# Patient Record
Sex: Male | Born: 1965 | Race: White | Hispanic: No | Smoking: Never smoker
Health system: Southern US, Community
[De-identification: ages and names within clinical notes are randomized; demographics above are authoritative.]

## PROBLEM LIST (undated history)

## (undated) DIAGNOSIS — I10 Essential (primary) hypertension: Secondary | ICD-10-CM

## (undated) DIAGNOSIS — E119 Type 2 diabetes mellitus without complications: Secondary | ICD-10-CM

## (undated) DIAGNOSIS — I219 Acute myocardial infarction, unspecified: Secondary | ICD-10-CM

## (undated) DIAGNOSIS — J939 Pneumothorax, unspecified: Secondary | ICD-10-CM

## (undated) HISTORY — PX: CHOLECYSTECTOMY: SHX55

---

## 2013-02-04 ENCOUNTER — Ambulatory Visit: Payer: Self-pay | Admitting: Family Medicine

## 2014-08-25 ENCOUNTER — Observation Stay: Payer: Self-pay | Admitting: Internal Medicine

## 2014-08-25 LAB — PRO B NATRIURETIC PEPTIDE: B-Type Natriuretic Peptide: 28 pg/mL (ref 0–125)

## 2014-08-25 LAB — CK TOTAL AND CKMB (NOT AT ARMC)
CK, TOTAL: 100 U/L (ref 39–308)
CK, Total: 106 U/L (ref 39–308)
CK, Total: 101 U/L (ref 39–308)
CK-MB: 1.2 ng/mL (ref 0.5–3.6)
CK-MB: 3.3 ng/mL (ref 0.5–3.6)
CK-MB: 3.5 ng/mL (ref 0.5–3.6)

## 2014-08-25 LAB — CBC
HCT: 44.5 % (ref 40.0–52.0)
HGB: 14.9 g/dL (ref 13.0–18.0)
MCH: 29.6 pg (ref 26.0–34.0)
MCHC: 33.4 g/dL (ref 32.0–36.0)
MCV: 89 fL (ref 80–100)
Platelet: 224 10*3/uL (ref 150–440)
RBC: 5.02 10*6/uL (ref 4.40–5.90)
RDW: 13.2 % (ref 11.5–14.5)
WBC: 6.3 10*3/uL (ref 3.8–10.6)

## 2014-08-25 LAB — CK-MB
CK-MB: 3.2 ng/mL (ref 0.5–3.6)
CK-MB: 2.7 ng/mL (ref 0.5–3.6)

## 2014-08-25 LAB — BASIC METABOLIC PANEL
Anion Gap: 8 (ref 7–16)
BUN: 16 mg/dL (ref 7–18)
CHLORIDE: 100 mmol/L (ref 98–107)
Calcium, Total: 8.8 mg/dL (ref 8.5–10.1)
Co2: 29 mmol/L (ref 21–32)
Creatinine: 1.2 mg/dL (ref 0.60–1.30)
EGFR (African American): >60
EGFR (Non-African Amer.): >60
Glucose: 197 mg/dL — ABNORMAL HIGH (ref 65–99)
OSMOLALITY: 280 (ref 275–301)
POTASSIUM: 3.7 mmol/L (ref 3.5–5.1)
Sodium: 137 mmol/L (ref 136–145)

## 2014-08-25 LAB — TROPONIN I
TROPONIN-I: 0.39 ng/mL — AB
Troponin-I: 0.28 ng/mL — ABNORMAL HIGH

## 2014-08-25 LAB — HEMOGLOBIN A1C: Hemoglobin A1C: 8.4 % — ABNORMAL HIGH (ref 4.2–6.3)

## 2014-08-26 LAB — LIPID PANEL
Cholesterol: 147 mg/dL (ref 0–200)
HDL Cholesterol: 22 mg/dL — ABNORMAL LOW (ref 40–60)
LDL CHOLESTEROL, CALC: 65 mg/dL (ref 0–100)
TRIGLYCERIDES: 300 mg/dL — AB (ref 0–200)
VLDL Cholesterol, Calc: 60 mg/dL — ABNORMAL HIGH (ref 5–40)

## 2014-08-26 LAB — TSH: THYROID STIMULATING HORM: 3.69 u[IU]/mL

## 2014-08-27 ENCOUNTER — Inpatient Hospital Stay
Admission: AD | Admit: 2014-08-27 | Payer: Self-pay | Source: Other Acute Inpatient Hospital | Admitting: Cardiothoracic Surgery

## 2014-08-27 ENCOUNTER — Inpatient Hospital Stay (HOSPITAL_COMMUNITY)
Admission: AD | Admit: 2014-08-27 | Discharge: 2014-09-04 | DRG: 236 | Disposition: A | Discharge status: Home / Self Care | Payer: 59 | Source: Other Acute Inpatient Hospital | Attending: Cardiothoracic Surgery | Admitting: Cardiothoracic Surgery

## 2014-08-27 DIAGNOSIS — I200 Unstable angina: Secondary | ICD-10-CM

## 2014-08-27 DIAGNOSIS — D696 Thrombocytopenia, unspecified: Secondary | ICD-10-CM | POA: Diagnosis not present

## 2014-08-27 DIAGNOSIS — J939 Pneumothorax, unspecified: Secondary | ICD-10-CM

## 2014-08-27 DIAGNOSIS — E669 Obesity, unspecified: Secondary | ICD-10-CM | POA: Diagnosis present

## 2014-08-27 DIAGNOSIS — Z6839 Body mass index (BMI) 39.0-39.9, adult: Secondary | ICD-10-CM | POA: Diagnosis not present

## 2014-08-27 DIAGNOSIS — E781 Pure hyperglyceridemia: Secondary | ICD-10-CM | POA: Diagnosis present

## 2014-08-27 DIAGNOSIS — E119 Type 2 diabetes mellitus without complications: Secondary | ICD-10-CM

## 2014-08-27 DIAGNOSIS — I214 Non-ST elevation (NSTEMI) myocardial infarction: Secondary | ICD-10-CM | POA: Diagnosis present

## 2014-08-27 DIAGNOSIS — Z6841 Body Mass Index (BMI) 40.0 and over, adult: Secondary | ICD-10-CM

## 2014-08-27 DIAGNOSIS — Z7982 Long term (current) use of aspirin: Secondary | ICD-10-CM | POA: Diagnosis not present

## 2014-08-27 DIAGNOSIS — R079 Chest pain, unspecified: Secondary | ICD-10-CM | POA: Diagnosis present

## 2014-08-27 DIAGNOSIS — E785 Hyperlipidemia, unspecified: Secondary | ICD-10-CM | POA: Diagnosis present

## 2014-08-27 DIAGNOSIS — I2510 Atherosclerotic heart disease of native coronary artery without angina pectoris: Secondary | ICD-10-CM | POA: Diagnosis present

## 2014-08-27 DIAGNOSIS — I10 Essential (primary) hypertension: Secondary | ICD-10-CM

## 2014-08-27 DIAGNOSIS — E8770 Fluid overload, unspecified: Secondary | ICD-10-CM | POA: Diagnosis not present

## 2014-08-27 DIAGNOSIS — I25110 Atherosclerotic heart disease of native coronary artery with unstable angina pectoris: Secondary | ICD-10-CM

## 2014-08-27 DIAGNOSIS — D62 Acute posthemorrhagic anemia: Secondary | ICD-10-CM | POA: Diagnosis not present

## 2014-08-27 DIAGNOSIS — R0602 Shortness of breath: Secondary | ICD-10-CM

## 2014-08-27 DIAGNOSIS — Z951 Presence of aortocoronary bypass graft: Secondary | ICD-10-CM

## 2014-08-27 HISTORY — DX: Acute myocardial infarction, unspecified: I21.9

## 2014-08-27 HISTORY — DX: Pneumothorax, unspecified: J93.9

## 2014-08-27 HISTORY — DX: Type 2 diabetes mellitus without complications: E11.9

## 2014-08-27 HISTORY — DX: Essential (primary) hypertension: I10

## 2014-08-27 LAB — CBC
HCT: 44.1 % (ref 39.0–52.0)
Hemoglobin: 15 g/dL (ref 13.0–17.0)
MCH: 29.5 pg (ref 26.0–34.0)
MCHC: 34 g/dL (ref 30.0–36.0)
MCV: 86.6 fL (ref 78.0–100.0)
Platelets: 236 10*3/uL (ref 150–400)
RBC: 5.09 MIL/uL (ref 4.22–5.81)
RDW: 13.2 % (ref 11.5–15.5)
WBC: 8.6 10*3/uL (ref 4.0–10.5)

## 2014-08-27 LAB — SURGICAL PCR SCREEN
MRSA, PCR: NEGATIVE
Staphylococcus aureus: POSITIVE — AB

## 2014-08-27 LAB — CREATININE, SERUM
Creatinine, Ser: 1.14 mg/dL (ref 0.50–1.35)
GFR calc Af Amer: 85 mL/min — ABNORMAL LOW (ref >90)
GFR calc non Af Amer: 73 mL/min — ABNORMAL LOW (ref >90)

## 2014-08-27 LAB — URINALYSIS, ROUTINE W REFLEX MICROSCOPIC
Bilirubin Urine: NEGATIVE
Glucose, UA: NEGATIVE mg/dL
Hgb urine dipstick: NEGATIVE
Ketones, ur: NEGATIVE mg/dL
Leukocytes, UA: NEGATIVE
Nitrite: NEGATIVE
Protein, ur: NEGATIVE mg/dL
Specific Gravity, Urine: 1.015 (ref 1.005–1.030)
Urobilinogen, UA: 1 mg/dL (ref 0.0–1.0)
pH: 5.5 (ref 5.0–8.0)

## 2014-08-27 LAB — GLUCOSE, CAPILLARY
Glucose-Capillary: 181 mg/dL — ABNORMAL HIGH (ref 70–99)
Glucose-Capillary: 180 mg/dL — ABNORMAL HIGH (ref 70–99)
Glucose-Capillary: 155 mg/dL — ABNORMAL HIGH (ref 70–99)

## 2014-08-27 MED ORDER — METOPROLOL TARTRATE 50 MG PO TABS
50.0000 mg | ORAL_TABLET | Freq: Two times a day (BID) | ORAL | Status: DC
  Administered 2014-08-27 – 2014-08-29 (×5): 50 mg via ORAL
  Filled 2014-08-27 (×7): qty 1

## 2014-08-27 MED ORDER — DOCUSATE SODIUM 100 MG PO CAPS
100.0000 mg | ORAL_CAPSULE | Freq: Two times a day (BID) | ORAL | Status: DC
  Administered 2014-08-27 – 2014-08-29 (×6): 100 mg via ORAL
  Filled 2014-08-27 (×8): qty 1

## 2014-08-27 MED ORDER — INSULIN ASPART 100 UNIT/ML ~~LOC~~ SOLN
0.0000 [IU] | Freq: Three times a day (TID) | SUBCUTANEOUS | Status: DC
  Administered 2014-08-27 – 2014-08-28 (×3): 4 [IU] via SUBCUTANEOUS
  Administered 2014-08-28: 3 [IU] via SUBCUTANEOUS
  Administered 2014-08-29 (×2): 4 [IU] via SUBCUTANEOUS

## 2014-08-27 MED ORDER — SODIUM CHLORIDE 0.9 % IJ SOLN: 3.0000 mL | INTRAMUSCULAR | Status: DC | PRN

## 2014-08-27 MED ORDER — ENOXAPARIN SODIUM 40 MG/0.4ML ~~LOC~~ SOLN
40.0000 mg | SUBCUTANEOUS | Status: DC
  Administered 2014-08-27 – 2014-08-29 (×3): 40 mg via SUBCUTANEOUS
  Filled 2014-08-27 (×4): qty 0.4

## 2014-08-27 MED ORDER — SODIUM CHLORIDE 0.9 % IJ SOLN
3.0000 mL | Freq: Two times a day (BID) | INTRAMUSCULAR | Status: DC
  Administered 2014-08-27 – 2014-08-29 (×5): 3 mL via INTRAVENOUS

## 2014-08-27 MED ORDER — ALUM & MAG HYDROXIDE-SIMETH 200-200-20 MG/5ML PO SUSP: 30.0000 mL | Freq: Four times a day (QID) | ORAL | Status: DC | PRN

## 2014-08-27 MED ORDER — ADULT MULTIVITAMIN W/MINERALS CH
1.0000 | ORAL_TABLET | Freq: Every day | ORAL | Status: DC
  Administered 2014-08-27 – 2014-08-29 (×3): 1 via ORAL
  Filled 2014-08-27 (×4): qty 1

## 2014-08-27 MED ORDER — TRAMADOL HCL 50 MG PO TABS: 50.0000 mg | ORAL_TABLET | Freq: Four times a day (QID) | ORAL | Status: DC | PRN

## 2014-08-27 MED ORDER — SODIUM CHLORIDE 0.9 % IV SOLN: 250.0000 mL | INTRAVENOUS | Status: DC | PRN

## 2014-08-27 MED ORDER — ZOLPIDEM TARTRATE 5 MG PO TABS: 5.0000 mg | ORAL_TABLET | Freq: Every evening | ORAL | Status: DC | PRN

## 2014-08-27 MED ORDER — LOSARTAN POTASSIUM 50 MG PO TABS: 50.0000 mg | ORAL_TABLET | Freq: Every day | ORAL | Status: DC

## 2014-08-27 MED ORDER — LOSARTAN POTASSIUM 50 MG PO TABS
50.0000 mg | ORAL_TABLET | Freq: Two times a day (BID) | ORAL | Status: DC
  Administered 2014-08-27 – 2014-08-29 (×5): 50 mg via ORAL
  Filled 2014-08-27 (×9): qty 1

## 2014-08-27 MED ORDER — SODIUM CHLORIDE 0.9 % IJ SOLN
3.0000 mL | Freq: Two times a day (BID) | INTRAMUSCULAR | Status: DC
  Administered 2014-08-27 – 2014-08-28 (×2): 3 mL via INTRAVENOUS

## 2014-08-27 MED ORDER — ACETAMINOPHEN 325 MG PO TABS: 650.0000 mg | ORAL_TABLET | Freq: Four times a day (QID) | ORAL | Status: DC | PRN

## 2014-08-27 MED ORDER — ENOXAPARIN SODIUM 40 MG/0.4ML ~~LOC~~ SOLN: 40.0000 mg | SUBCUTANEOUS | Status: DC

## 2014-08-27 MED ORDER — SORBITOL 70 % SOLN
30.0000 mL | Freq: Every day | Status: DC | PRN
  Filled 2014-08-27: qty 30

## 2014-08-27 MED ORDER — PANTOPRAZOLE SODIUM 40 MG PO TBEC
40.0000 mg | DELAYED_RELEASE_TABLET | Freq: Every day | ORAL | Status: DC
  Administered 2014-08-27 – 2014-08-29 (×3): 40 mg via ORAL
  Filled 2014-08-27 (×3): qty 1

## 2014-08-27 MED ORDER — INSULIN ASPART 100 UNIT/ML ~~LOC~~ SOLN: 0.0000 [IU] | Freq: Every day | SUBCUTANEOUS | Status: DC

## 2014-08-27 MED ORDER — INSULIN GLARGINE 100 UNIT/ML ~~LOC~~ SOLN
20.0000 [IU] | Freq: Two times a day (BID) | SUBCUTANEOUS | Status: DC
  Administered 2014-08-27: 20 [IU] via SUBCUTANEOUS
  Filled 2014-08-27 (×5): qty 0.2

## 2014-08-27 MED ORDER — ACETAMINOPHEN 650 MG RE SUPP: 650.0000 mg | Freq: Four times a day (QID) | RECTAL | Status: DC | PRN

## 2014-08-27 MED ORDER — ASPIRIN EC 325 MG PO TBEC
325.0000 mg | DELAYED_RELEASE_TABLET | Freq: Every day | ORAL | Status: DC
  Administered 2014-08-27 – 2014-08-29 (×3): 325 mg via ORAL
  Filled 2014-08-27 (×4): qty 1

## 2014-08-27 NOTE — Consult Note (Addendum)
[address]       [place] [address]             [phone]        MCKINNON GLICK [facility] Medical Record #[id number] Date of Birth: 1966/08/06  Referring physician-Bruce Gwen Pounds M.D., cardiology  [facility] care physician  Chief Complaint:   Left chest, arm pain  History of Present Illness:     Patient examined, cardiac catheterization and 2-D echocardiogram reviewed  49 year old Caucasian diabetic male nonsmoker with positive family history for CAD/CABG is transferred from [facility] hospital following his presentation with chest pain and non-ST elevation MI. 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 Echocardiogram demonstrates no significant valvular disease. The patient is currently stable without chest pain.  His risk factors include uncontrolled and newly diagnosed diabetes mellitus, poorly controlled hypertension, obesity, and dyslipidemia   Current Activity/ Functional Status: The patient is employed and has a Research scientist (physical sciences) based occupation  He lives alone with dogs but has adult children nearby  Zubrod Score: At the time of surgery this patient's most appropriate activity status/level should be described as:     0    Normal activity, no symptoms     1    Restricted in physical strenuous activity but ambulatory, able to do out light work     2    Ambulatory and capable of self care, unable to do work activities, up and about                 more than 50%  Of the time                                3    Only limited self care, in bed greater than 50% of waking hours     4    Completely disabled, no self care, confined to bed or chair     5    Moribund  No past medical history on file.  Hypertension, type 2 diabetes mellitus, obesity, Status post  cholecystectomy Nonsmoker   History  Smoking status  . Not on file  Smokeless tobacco  . Not on file    History  Alcohol Use: Not on file   Family history-father had CABG at age 50, diabetes on his mother's side of the family History   Social History  . Marital Status: Divorced    Spouse Name: N/A    Number of Children: N/A  . Years of Education: N/A   Occupational History  . Not on file.   Social History Main Topics  . Smoking status: Not on file  . Smokeless tobacco: Not on file  . Alcohol Use: Not on file  . Drug Use: Not on file  . Sexual Activity: Not on file   Other Topics Concern  . Not on file   Social History Narrative  . No narrative on file    Allergies  Allergen Reactions  . Sulfa Antibiotics Other (See Comments)    Unknown-as child    Current Facility-Administered Medications  Medication Dose Route Frequency Provider Last Rate Last Dose  . 0.9 %  sodium chloride infusion  250 mL Intravenous PRN Kerin Perna, MD      . acetaminophen (TYLENOL) tablet 650 mg  650 mg Oral  Q6H PRN Kerin PernaPeter Van Trigt, MD       Or  . acetaminophen (TYLENOL) suppository 650 mg  650 mg Rectal Q6H PRN Kerin PernaPeter Van Trigt, MD      . alum & mag hydroxide-simeth (MAALOX/MYLANTA) 200-200-20 MG/5ML suspension 30 mL  30 mL Oral Q6H PRN Kerin PernaPeter Van Trigt, MD      . aspirin EC tablet 325 mg  325 mg Oral Daily Kerin PernaPeter Van Trigt, MD      . docusate sodium (COLACE) capsule 100 mg  100 mg Oral BID Kerin PernaPeter Van Trigt, MD      . enoxaparin (LOVENOX) injection 40 mg  40 mg Subcutaneous Q24H Kerin PernaPeter Van Trigt, MD      . insulin aspart (novoLOG) injection 0-20 Units  0-20 Units Subcutaneous TID WC Kerin PernaPeter Van Trigt, MD      . insulin aspart (novoLOG) injection 0-5 Units  0-5 Units Subcutaneous QHS Kerin PernaPeter Van Trigt, MD      . insulin glargine (LANTUS) injection 20 Units  20 Units Subcutaneous BID Kerin PernaPeter Van Trigt, MD      . Melene Muller[START ON 08/28/2014] losartan (COZAAR) tablet 50 mg  50 mg Oral Daily Kerin PernaPeter Van  Trigt, MD      . metoprolol (LOPRESSOR) tablet 50 mg  50 mg Oral BID Kerin PernaPeter Van Trigt, MD      . multivitamin with minerals tablet 1 tablet  1 tablet Oral Daily Kerin PernaPeter Van Trigt, MD      . pantoprazole (PROTONIX) EC tablet 40 mg  40 mg Oral Q1200 Kerin PernaPeter Van Trigt, MD      . sodium chloride 0.9 % injection 3 mL  3 mL Intravenous Q12H Kerin PernaPeter Van Trigt, MD      . sodium chloride 0.9 % injection 3 mL  3 mL Intravenous Q12H Kerin PernaPeter Van Trigt, MD      . sodium chloride 0.9 % injection 3 mL  3 mL Intravenous PRN Kerin PernaPeter Van Trigt, MD      . sorbitol 70 % solution 30 mL  30 mL Oral Daily PRN Kerin PernaPeter Van Trigt, MD      . traMADol Janean Sark(ULTRAM) tablet 50 mg  50 mg Oral Q6H PRN Kerin PernaPeter Van Trigt, MD      . zolpidem (AMBIEN) tablet 5 mg  5 mg Oral QHS PRN,MR X 1 Kerin PernaPeter Van Trigt, MD        Prescriptions prior to admission  Medication Sig Dispense Refill Last Dose  . aspirin 325 MG EC tablet Take 325 mg by mouth once.   08/26/2014 at 0900  . aspirin EC 81 MG tablet Take 81 mg by mouth 2 (two) times daily.   Past Week at Unknown time  . hydrochlorothiazide (HYDRODIURIL) 25 MG tablet Take 25 mg by mouth daily.   08/27/2014 at Unknown time  . losartan (COZAAR) 50 MG tablet Take 1 tablet by mouth 2 (two) times daily.   0 08/27/2014 at am  . metFORMIN (GLUCOPHAGE) 500 MG tablet Take 500 mg by mouth 2 (two) times daily with a meal.    08/27/2014 at am  . metoprolol succinate (TOPROL-XL) 100 MG 24 hr tablet Take 100 mg by mouth every morning. Take with or immediately following a meal.   08/27/2014 at 0900      Review of Systems:   The patient is right-hand dominant The patient denies previous thoracic trauma pneumothorax or pneumonia The patient denies history of cardiac murmur arrhythmia or previous MI The patient denies history of ulcer disease, hepatitis colitis or blood per  rectum The patient denies history of TIA stroke or neuropathy    Cardiac Review of Systems: Y or N  Chest Pain [  yes  ]  Resting SOB [  no  ] Exertional SOB  [no  ]  Orthopnea [  ]   Pedal Edema [ no  ]    Palpitations [ no ] Syncope  [ no ]   Presyncope [   no]  General Review of Systems: [Y] = yes [  ]=no Constitional: recent weight change [  ]; anorexia [  ]; fatigue [  ]; nausea [  ]; night sweats [  ]; fever [  ]; or chills [  ]                                                               Dental: poor dentition[  ]; Last Dentist visit every 6 months:   Eye : blurred vision [  ]; diplopia [   ]; vision changes [  ];  Amaurosis fugax[  ]; Resp: cough [  ];  wheezing[  ];  hemoptysis[  ]; shortness of breath[  ]; paroxysmal nocturnal dyspnea[  ]; dyspnea on exertion[  ]; or orthopnea[  ];  GI:  gallstones[  ], vomiting[  ];  dysphagia[  ]; melena[  ];  hematochezia [  ]; heartburn[  ];   Hx of  Colonoscopy[  ]; GU: kidney stones [  ]; hematuria[  ];   dysuria [  ];  nocturia[  ];  history of     obstruction [  ]; urinary frequency [  ]             Skin: rash, swelling[  ];, hair loss[  ];  peripheral edema[  ];  or itching[  ]; Musculosketetal: myalgias[  ];  joint swelling[  ];  joint erythema[  ];  joint pain[  ];  back pain[  ];  Heme/Lymph: bruising[  ];  bleeding[  ];  anemia[  ];  Neuro: TIA[  ];  headaches[  ];  stroke[  ];  vertigo[  ];  seizures[  ];   paresthesias[  ];  difficulty walking[  ];  Psych:depression[  ]; anxiety[  ];  Endocrine: diabetes[yes newly diagnosed  ];  thyroid dysfunction[  ];  Immunizations: Flu [  ]; Pneumococcal[  ];  Other:  Physical Exam: BP 157/98 mmHg  Pulse 80  Temp(Src) 98.2 F (36.8 C) (Oral)  Resp 16  Ht 6' (1.829 m)  Wt 282 lb 10.1 oz (128.2 kg)  BMI 38.32 kg/m2  SpO2 97%  Gen. appearance-middle-aged obese Caucasian male no acute distress H ENT-normocephalic pupils equal dentition good Neck-no JVD mass or carotid bruit Thorax-no deformity or tenderness, breath sounds clear Cardiac-regular rhythm without murmur rub or gallop Abdomen-soft obese nontender without pulsatile  mass Extremities-no clubbing cyanosis edema or tenderness Vascular-compression dressing over right femoral artery without hematoma, pulses present in all extremities, no evidence of varicose veins of his lower extremities Neurologic-alert oriented and appropriate, no motor deficit   Diagnostic Studies & Laboratory data:     Recent Radiology Findings:   No results found.    Recent Lab Findings: No results found for: WBC, HGB, HCT, PLT, GLUCOSE, CHOL, TRIG, HDL, LDLDIRECT, LDLCALC, ALT, AST,  NA, K, CL, CREATININE, BUN, CO2, TSH, INR, GLUF, HGBA1C    Assessment / Plan:     49 year old obese diabetic with hypertension presents with a non-ST elevation MI is found have three-vessel CAD.  The patient be scheduled for multivessel CABG on the OR schedule the first part of this week. Plan left IMA and possible left radial artery graft as well as endoscopic vein graft to the LAD, PDA, OM and distal circumflex. I discussed the procedure and plan for surgery the patient and he understands and is in agreement.       @ME1 @ 08/27/2014 2:13 PM

## 2014-08-28 ENCOUNTER — Inpatient Hospital Stay (HOSPITAL_COMMUNITY): Payer: 59

## 2014-08-28 DIAGNOSIS — I214 Non-ST elevation (NSTEMI) myocardial infarction: Secondary | ICD-10-CM

## 2014-08-28 DIAGNOSIS — E119 Type 2 diabetes mellitus without complications: Secondary | ICD-10-CM

## 2014-08-28 DIAGNOSIS — I10 Essential (primary) hypertension: Secondary | ICD-10-CM

## 2014-08-28 LAB — CBC
HCT: 43.2 % (ref 39.0–52.0)
Hemoglobin: 14.8 g/dL (ref 13.0–17.0)
MCH: 29.7 pg (ref 26.0–34.0)
MCHC: 34.3 g/dL (ref 30.0–36.0)
MCV: 86.7 fL (ref 78.0–100.0)
Platelets: 198 10*3/uL (ref 150–400)
RBC: 4.98 MIL/uL (ref 4.22–5.81)
RDW: 13.3 % (ref 11.5–15.5)
WBC: 8.5 10*3/uL (ref 4.0–10.5)

## 2014-08-28 LAB — GLUCOSE, CAPILLARY
Glucose-Capillary: 121 mg/dL — ABNORMAL HIGH (ref 70–99)
Glucose-Capillary: 168 mg/dL — ABNORMAL HIGH (ref 70–99)
Glucose-Capillary: 162 mg/dL — ABNORMAL HIGH (ref 70–99)
Glucose-Capillary: 171 mg/dL — ABNORMAL HIGH (ref 70–99)

## 2014-08-28 LAB — COMPREHENSIVE METABOLIC PANEL
ALT: 61 U/L — ABNORMAL HIGH (ref 0–53)
AST: 39 U/L — ABNORMAL HIGH (ref 0–37)
Albumin: 3.7 g/dL (ref 3.5–5.2)
Alkaline Phosphatase: 59 U/L (ref 39–117)
Anion gap: 8 (ref 5–15)
BUN: 15 mg/dL (ref 6–23)
CO2: 32 mmol/L (ref 19–32)
Calcium: 9.3 mg/dL (ref 8.4–10.5)
Chloride: 98 mEq/L (ref 96–112)
Creatinine, Ser: 1.29 mg/dL (ref 0.50–1.35)
GFR calc Af Amer: 73 mL/min — ABNORMAL LOW (ref >90)
GFR calc non Af Amer: 63 mL/min — ABNORMAL LOW (ref >90)
Glucose, Bld: 140 mg/dL — ABNORMAL HIGH (ref 70–99)
Potassium: 3.9 mmol/L (ref 3.5–5.1)
Sodium: 138 mmol/L (ref 135–145)
Total Bilirubin: 0.9 mg/dL (ref 0.3–1.2)
Total Protein: 7.5 g/dL (ref 6.0–8.3)

## 2014-08-28 MED ORDER — MUPIROCIN CALCIUM 2 % EX CREA
TOPICAL_CREAM | Freq: Two times a day (BID) | CUTANEOUS | Status: DC
  Administered 2014-08-28 – 2014-08-29 (×4): via TOPICAL
  Filled 2014-08-28: qty 15

## 2014-08-28 MED ORDER — INSULIN GLARGINE 100 UNIT/ML ~~LOC~~ SOLN
25.0000 [IU] | Freq: Two times a day (BID) | SUBCUTANEOUS | Status: DC
  Administered 2014-08-28 – 2014-08-29 (×3): 25 [IU] via SUBCUTANEOUS
  Filled 2014-08-28 (×8): qty 0.25

## 2014-08-28 MED ORDER — MUPIROCIN CALCIUM 2 % EX CREA: TOPICAL_CREAM | Freq: Two times a day (BID) | CUTANEOUS | Status: DC

## 2014-08-28 NOTE — Progress Notes (Signed)
  Subjective: Patient denies angina No bleeding from groin cath site Blood sugars remained poorly controlled-increase Lantus and continue sliding scale insulin Post Creatinine slightly elevated-check tomorrow Pre-CABG Doppler studies pending for possible left radial artery graft CABG planned Tuesday January 19 Objective: Vital signs in last 24 hours: Temp:  [98.8 F (37.1 C)-98.9 F (37.2 C)] 98.9 F (37.2 C) (01/17 0523) Pulse Rate:  [81-85] 81 (01/17 0523) Cardiac Rhythm:  [-]  Resp:  [18] 18 (01/17 0523) BP: (129-152)/(72-96) 129/72 mmHg (01/17 0523) SpO2:  [99 %-100 %] 99 % (01/17 0523) Weight:  [283 lb 12.8 oz (128.731 kg)] 283 lb 12.8 oz (128.731 kg) (01/17 0523)  Hemodynamic parameters for last 24 hours:    Intake/Output from previous day:   Intake/Output this shift:    Lungs clear, sinus rhythm  Lab Results:  Recent Labs  08/27/14 1435 08/28/14 0520  WBC 8.6 8.5  HGB 15.0 14.8  HCT 44.1 43.2  PLT 236 198   BMET:  Recent Labs  08/27/14 1435 08/28/14 0520  NA  --  138  K  --  3.9  CL  --  98  CO2  --  32  GLUCOSE  --  140*  BUN  --  15  CREATININE 1.14 1.29  CALCIUM  --  9.3    PT/INR: No results for input(s): LABPROT, INR in the last 72 hours. ABG No results found for: PHART, HCO3, TCO2, ACIDBASEDEF, O2SAT CBG (last 3)   Recent Labs  08/27/14 2355 08/28/14 0535 08/28/14 1156  GLUCAP 168* 121* 162*    Assessment/Plan: S/P   Improved diabetic coverage by increasing Lantus insulin Start diabetic teaching for new diagnosis-he has no home glucose meter  LOS: 1 day    VAN TRIGT III,Vivan Agostino 08/28/2014

## 2014-08-28 NOTE — Progress Notes (Signed)
Utilization review completed.  

## 2014-08-28 NOTE — Progress Notes (Addendum)
      [address]       [place] [id number]             [phone]             Subjective: Patient denies chest pain or shortness of breath  Objective: Vital signs in last 24 hours: Temp:  [98.2 F (36.8 C)-98.9 F (37.2 C)] 98.9 F (37.2 C) (01/17 0523) Pulse Rate:  [80-85] 81 (01/17 0523) Cardiac Rhythm:  [-] Normal sinus rhythm (01/16 1235) Resp:  [16-18] 18 (01/17 0523) BP: (129-157)/(72-98) 129/72 mmHg (01/17 0523) SpO2:  [97 %-100 %] 99 % (01/17 0523) Weight:  [282 lb 10.1 oz (128.2 kg)-283 lb 12.8 oz (128.731 kg)] 283 lb 12.8 oz (128.731 kg) (01/17 0523)   Current Weight  08/28/14 283 lb 12.8 oz (128.731 kg)         Physical Exam:  Cardiovascular: RRR, no murmurs Pulmonary: Clear to auscultation bilaterally; no rales, wheezes, or rhonchi. Abdomen: Soft, non tender, bowel sounds present. Extremities: Trace bilateral lower extremity edema. .  Lab Results: CBC: Recent Labs  08/27/14 1435 08/28/14 0520  WBC 8.6 8.5  HGB 15.0 14.8  HCT 44.1 43.2  PLT 236 198   BMET:  Recent Labs  08/27/14 1435 08/28/14 0520  NA  --  138  K  --  3.9  CL  --  98  CO2  --  32  GLUCOSE  --  140*  BUN  --  15  CREATININE 1.14 1.29  CALCIUM  --  9.3    PT/INR: No results found for: INR, PROTIME ABG:  INR: Will add last result for INR, ABG once components are confirmed Will add last 4 CBG results once components are confirmed  Assessment/Plan:  1. CV - S/p NSTEMI. SR in the 80's. Will need eventual CABG 2.DM-155/168/121.Continue Insulin. HGA1C ordered.   ZIMMERMAN,DONIELLE MPA-C 08/28/2014,597:[id number] AM  49 year old patient with severe CAD and non-ST elevation MI Would probably benefit from radial artery graft due to his occluded RCA-preop Doppler studies are pending Plan CABG Tuesday a.m. patient examined and medical record reviewed,agree with above note. VAN TRIGT III,PETER 08/28/2014

## 2014-08-29 ENCOUNTER — Encounter (HOSPITAL_COMMUNITY): Payer: Self-pay

## 2014-08-29 ENCOUNTER — Inpatient Hospital Stay (HOSPITAL_COMMUNITY): Payer: 59

## 2014-08-29 ENCOUNTER — Other Ambulatory Visit: Payer: Self-pay | Admitting: *Deleted

## 2014-08-29 DIAGNOSIS — I2510 Atherosclerotic heart disease of native coronary artery without angina pectoris: Secondary | ICD-10-CM

## 2014-08-29 DIAGNOSIS — Z01810 Encounter for preprocedural cardiovascular examination: Secondary | ICD-10-CM

## 2014-08-29 LAB — PULMONARY FUNCTION TEST
DL/VA % pred: 132 %
DL/VA: 6.23 ml/min/mmHg/L
DLCO cor % pred: 106 %
DLCO cor: 35.77 ml/min/mmHg
DLCO unc % pred: 106 %
DLCO unc: 35.97 ml/min/mmHg
FEF 25-75 Post: 4.7 L/sec
FEF 25-75 Pre: 3.65 L/sec
FEF2575-%Change-Post: 28 %
FEF2575-%Pred-Post: 132 %
FEF2575-%Pred-Pre: 103 %
FEV1-%Change-Post: 8 %
FEV1-%Pred-Post: 91 %
FEV1-%Pred-Pre: 84 %
FEV1-Post: 3.68 L
FEV1-Pre: 3.41 L
FEV1FVC-%Change-Post: 4 %
FEV1FVC-%Pred-Pre: 104 %
FEV6-%Change-Post: 3 %
FEV6-%Pred-Post: 84 %
FEV6-%Pred-Pre: 81 %
FEV6-Post: 4.27 L
FEV6-Pre: 4.11 L
FEV6FVC-%Change-Post: 1 %
FEV6FVC-%Pred-Post: 103 %
FEV6FVC-%Pred-Pre: 101 %
FVC-%Change-Post: 3 %
FVC-%Pred-Post: 82 %
FVC-%Pred-Pre: 80 %
FVC-Post: 4.32 L
FVC-Pre: 4.18 L
Post FEV1/FVC ratio: 85 %
Post FEV6/FVC ratio: 100 %
Pre FEV1/FVC ratio: 82 %
Pre FEV6/FVC Ratio: 98 %
RV % pred: 72 %
RV: 1.53 L
TLC % pred: 83 %
TLC: 6.02 L

## 2014-08-29 LAB — HEMOGLOBIN A1C
Hgb A1c MFr Bld: 8.4 % — ABNORMAL HIGH (ref <5.7)
MEAN PLASMA GLUCOSE: 194 mg/dL — AB (ref <117)

## 2014-08-29 LAB — COMPREHENSIVE METABOLIC PANEL
ALT: 56 U/L — ABNORMAL HIGH (ref 0–53)
AST: 37 U/L (ref 0–37)
Albumin: 3.4 g/dL — ABNORMAL LOW (ref 3.5–5.2)
Alkaline Phosphatase: 53 U/L (ref 39–117)
Anion gap: 12 (ref 5–15)
BUN: 17 mg/dL (ref 6–23)
CO2: 22 mmol/L (ref 19–32)
Calcium: 8.7 mg/dL (ref 8.4–10.5)
Chloride: 101 mEq/L (ref 96–112)
Creatinine, Ser: 0.99 mg/dL (ref 0.50–1.35)
GFR calc Af Amer: >90 mL/min (ref >90)
GFR calc non Af Amer: >90 mL/min (ref >90)
Glucose, Bld: 172 mg/dL — ABNORMAL HIGH (ref 70–99)
Potassium: 3.7 mmol/L (ref 3.5–5.1)
Sodium: 135 mmol/L (ref 135–145)
Total Bilirubin: 0.6 mg/dL (ref 0.3–1.2)
Total Protein: 7.1 g/dL (ref 6.0–8.3)

## 2014-08-29 LAB — CBC
HCT: 41.4 % (ref 39.0–52.0)
Hemoglobin: 14.3 g/dL (ref 13.0–17.0)
MCH: 29.9 pg (ref 26.0–34.0)
MCHC: 34.5 g/dL (ref 30.0–36.0)
MCV: 86.4 fL (ref 78.0–100.0)
Platelets: 202 10*3/uL (ref 150–400)
RBC: 4.79 MIL/uL (ref 4.22–5.81)
RDW: 13.2 % (ref 11.5–15.5)
WBC: 6.9 10*3/uL (ref 4.0–10.5)

## 2014-08-29 LAB — BASIC METABOLIC PANEL
Anion gap: 9 (ref 5–15)
BUN: 15 mg/dL (ref 6–23)
CO2: 27 mmol/L (ref 19–32)
Calcium: 8.9 mg/dL (ref 8.4–10.5)
Chloride: 101 mEq/L (ref 96–112)
Creatinine, Ser: 1.24 mg/dL (ref 0.50–1.35)
GFR calc Af Amer: 77 mL/min — ABNORMAL LOW (ref >90)
GFR calc non Af Amer: 66 mL/min — ABNORMAL LOW (ref >90)
Glucose, Bld: 171 mg/dL — ABNORMAL HIGH (ref 70–99)
Potassium: 4.1 mmol/L (ref 3.5–5.1)
Sodium: 137 mmol/L (ref 135–145)

## 2014-08-29 LAB — TYPE AND SCREEN
ABO/RH(D): A POS
Antibody Screen: NEGATIVE

## 2014-08-29 LAB — GLUCOSE, CAPILLARY
Glucose-Capillary: 124 mg/dL — ABNORMAL HIGH (ref 70–99)
Glucose-Capillary: 170 mg/dL — ABNORMAL HIGH (ref 70–99)
Glucose-Capillary: 168 mg/dL — ABNORMAL HIGH (ref 70–99)
Glucose-Capillary: 138 mg/dL — ABNORMAL HIGH (ref 70–99)

## 2014-08-29 LAB — ABO/RH: ABO/RH(D): A POS

## 2014-08-29 MED ORDER — CEFUROXIME SODIUM 1.5 G IJ SOLR
1.5000 g | INTRAMUSCULAR | Status: AC
  Administered 2014-08-30: 1.5 g via INTRAVENOUS
  Filled 2014-08-29: qty 1.5

## 2014-08-29 MED ORDER — CHLORHEXIDINE GLUCONATE 4 % EX LIQD
60.0000 mL | Freq: Once | CUTANEOUS | Status: AC
  Administered 2014-08-30: 4 via TOPICAL
  Filled 2014-08-29 (×2): qty 60

## 2014-08-29 MED ORDER — DIAZEPAM 5 MG PO TABS
5.0000 mg | ORAL_TABLET | Freq: Once | ORAL | Status: AC
  Administered 2014-08-30: 5 mg via ORAL
  Filled 2014-08-29: qty 1

## 2014-08-29 MED ORDER — MAGNESIUM SULFATE 50 % IJ SOLN
40.0000 meq | INTRAMUSCULAR | Status: DC
  Filled 2014-08-29: qty 10

## 2014-08-29 MED ORDER — CHLORHEXIDINE GLUCONATE 4 % EX LIQD
60.0000 mL | Freq: Once | CUTANEOUS | Status: DC
  Filled 2014-08-29 (×2): qty 60

## 2014-08-29 MED ORDER — NITROGLYCERIN IN D5W 200-5 MCG/ML-% IV SOLN
2.0000 ug/min | INTRAVENOUS | Status: DC
  Filled 2014-08-29: qty 250

## 2014-08-29 MED ORDER — PLASMA-LYTE 148 IV SOLN
INTRAVENOUS | Status: AC
  Administered 2014-08-30: 09:00:00
  Filled 2014-08-29: qty 2.5

## 2014-08-29 MED ORDER — METOPROLOL TARTRATE 25 MG PO TABS
25.0000 mg | ORAL_TABLET | Freq: Once | ORAL | Status: AC
  Administered 2014-08-30: 25 mg via ORAL
  Filled 2014-08-29: qty 1

## 2014-08-29 MED ORDER — SODIUM CHLORIDE 0.9 % IV SOLN
INTRAVENOUS | Status: DC
  Filled 2014-08-29: qty 30

## 2014-08-29 MED ORDER — ALPRAZOLAM 0.25 MG PO TABS: 0.2500 mg | ORAL_TABLET | ORAL | Status: DC | PRN

## 2014-08-29 MED ORDER — SODIUM CHLORIDE 0.9 % IV SOLN
INTRAVENOUS | Status: DC
Start: 2014-08-30 — End: 2014-08-30
  Filled 2014-08-29: qty 40

## 2014-08-29 MED ORDER — DEXTROSE 5 % IV SOLN
750.0000 mg | INTRAVENOUS | Status: DC
  Filled 2014-08-29: qty 750

## 2014-08-29 MED ORDER — TEMAZEPAM 7.5 MG PO CAPS: 15.0000 mg | ORAL_CAPSULE | Freq: Once | ORAL | Status: AC | PRN

## 2014-08-29 MED ORDER — ALBUTEROL SULFATE (2.5 MG/3ML) 0.083% IN NEBU
2.5000 mg | INHALATION_SOLUTION | Freq: Once | RESPIRATORY_TRACT | Status: AC
  Administered 2014-08-29: 2.5 mg via RESPIRATORY_TRACT

## 2014-08-29 MED ORDER — VANCOMYCIN HCL 10 G IV SOLR
1500.0000 mg | INTRAVENOUS | Status: AC
  Administered 2014-08-30: 1500 mg via INTRAVENOUS
  Filled 2014-08-29: qty 1500

## 2014-08-29 MED ORDER — POTASSIUM CHLORIDE 2 MEQ/ML IV SOLN
80.0000 meq | INTRAVENOUS | Status: DC
  Filled 2014-08-29: qty 40

## 2014-08-29 MED ORDER — DOPAMINE-DEXTROSE 3.2-5 MG/ML-% IV SOLN
0.0000 ug/kg/min | INTRAVENOUS | Status: DC
  Filled 2014-08-29: qty 250

## 2014-08-29 MED ORDER — BISACODYL 5 MG PO TBEC
5.0000 mg | DELAYED_RELEASE_TABLET | Freq: Once | ORAL | Status: AC
  Administered 2014-08-29: 5 mg via ORAL
  Filled 2014-08-29: qty 1

## 2014-08-29 MED ORDER — DEXMEDETOMIDINE HCL IN NACL 400 MCG/100ML IV SOLN
0.1000 ug/kg/h | INTRAVENOUS | Status: DC
  Filled 2014-08-29: qty 100

## 2014-08-29 MED ORDER — INSULIN REGULAR HUMAN 100 UNIT/ML IJ SOLN
INTRAMUSCULAR | Status: DC
  Administered 2014-08-30: 5.7 [IU]/h via INTRAVENOUS
  Filled 2014-08-29: qty 2.5

## 2014-08-29 MED ORDER — PHENYLEPHRINE HCL 10 MG/ML IJ SOLN
30.0000 ug/min | INTRAMUSCULAR | Status: AC
  Administered 2014-08-30: 10 ug/min via INTRAVENOUS
  Filled 2014-08-29: qty 2

## 2014-08-29 MED ORDER — EPINEPHRINE HCL 1 MG/ML IJ SOLN
0.0000 ug/min | INTRAVENOUS | Status: DC
  Filled 2014-08-29: qty 4

## 2014-08-29 NOTE — Progress Notes (Addendum)
      [address]       [place] [id number]             [phone]          Subjective: Feels well, no CP or SOB  Objective: Vital signs in last 24 hours: Temp:  [98 F (36.7 C)-98.6 F (37 C)] 98 F (36.7 C) (01/18 91470608) Pulse Rate:  [78-92] 81 (01/18 [id number]) Cardiac Rhythm:  [-] Normal sinus rhythm (01/17 2100) Resp:  [16-18] 18 (01/18 0608) BP: (122-142)/(71-97) 125/77 mmHg (01/18 0608) SpO2:  [92 %-100 %] 92 % (01/18 0608) Weight:  [284 lb 9.8 oz (129.1 kg)] 284 lb 9.8 oz (129.1 kg) (01/18 [id number])  Hemodynamic parameters for last 24 hours:    Intake/Output from previous day: 01/17 0701 - 01/18 0700 In: 700 [P.O.:700] Out: -  Intake/Output this shift:    General appearance: alert, cooperative and no distress Heart: regular rate and rhythm Lungs: clear to auscultation bilaterally Abdomen: soft, non-tender; bowel sounds normal; no masses,  no organomegaly Extremities: no edema  Lab Results:  Recent Labs  08/27/14 1435 08/28/14 0520  WBC 8.6 8.5  HGB 15.0 14.8  HCT 44.1 43.2  PLT 236 198   BMET:  Recent Labs  08/28/14 0520 08/29/14 0449  NA 138 135  K 3.9 3.7  CL 98 101  CO2 32 22  GLUCOSE 140* 172*  BUN 15 17  CREATININE 1.29 0.99  CALCIUM 9.3 8.7    PT/INR: No results for input(s): LABPROT, INR in the last 72 hours. ABG No results found for: PHART, HCO3, TCO2, ACIDBASEDEF, O2SAT CBG (last 3)   Recent Labs  08/28/14 1656 08/28/14 2157 08/29/14 0602  GLUCAP 171* 124* 170*    Meds Scheduled Meds: . aspirin EC  325 mg Oral Daily  . docusate sodium  100 mg Oral BID  . enoxaparin (LOVENOX) injection  40 mg Subcutaneous Q24H  . insulin aspart  0-20 Units Subcutaneous TID WC  . insulin aspart  0-5 Units Subcutaneous QHS  . insulin glargine  25 Units Subcutaneous BID  . losartan  50 mg Oral BID  . metoprolol tartrate  50 mg Oral BID  . multivitamin with minerals  1 tablet Oral Daily  . mupirocin cream   Topical BID  .  pantoprazole  40 mg Oral Q1200  . sodium chloride  3 mL Intravenous Q12H  . sodium chloride  3 mL Intravenous Q12H   Continuous Infusions:  PRN Meds:.sodium chloride, acetaminophen **OR** acetaminophen, alum & mag hydroxide-simeth, sodium chloride, sorbitol, traMADol, zolpidem  Xrays X-ray Chest Pa And Lateral  08/28/2014   CLINICAL DATA:  Initial encounter for preop for CABG.  EXAM: CHEST  2 VIEW  COMPARISON:  08/25/2014  FINDINGS: Numerous leads and wires project over the chest. Midline trachea. Normal heart size and mediastinal contours. No pleural effusion or pneumothorax. Clear lungs.  IMPRESSION: No active cardiopulmonary disease.   Electronically Signed   By: Jeronimo GreavesKyle  Talbot M.D.   On: 08/28/2014 08:24    Assessment/Plan:  1 doing well, currently without symptoms, plan for OR in am.    LOS: 2 days    GOLD,WAYNE E 08/29/2014  patient examined and medical record reviewed,agree with above note. Plan CABG with LIMA , L radial artery and EVH in am   VAN TRIGT III,Krystalle Pilkington 08/29/2014

## 2014-08-29 NOTE — Progress Notes (Signed)
VASCULAR LAB PRELIMINARY  PRELIMINARY  PRELIMINARY  PRELIMINARY  Pre-op Cardiac Surgery  Carotid Findings:  [facility].  Upper Extremity Right Left  Brachial Pressures 126 triphasic 134 triphasic  Radial Waveforms triphasic triphasic  Ulnar Waveforms triphasic triphasic  Palmar Arch (Allen's Test) WNL WNL   Findings:  Doppler waveforms remain normal with ulnar and radial compressions bilaterally.    Lower  Extremity Right Left  Dorsalis Pedis    Anterior Tibial 146 triphasic 143 triphasic  Posterior Tibial 148 triphasic 158 triphasic  Ankle/Brachial Indices >1.0 >1.0    Findings:  ABI is within normal limits bilaterally.   Bali Lyn, RVT 08/29/2014, 2:33 PM

## 2014-08-30 ENCOUNTER — Inpatient Hospital Stay (HOSPITAL_COMMUNITY): Payer: 59 | Admitting: Critical Care Medicine

## 2014-08-30 ENCOUNTER — Encounter (HOSPITAL_COMMUNITY)
Admission: AD | Disposition: A | Discharge status: Home / Self Care | Payer: MEDICAID | Source: Other Acute Inpatient Hospital | Attending: Cardiothoracic Surgery

## 2014-08-30 ENCOUNTER — Inpatient Hospital Stay (HOSPITAL_COMMUNITY): Payer: 59

## 2014-08-30 DIAGNOSIS — Z951 Presence of aortocoronary bypass graft: Secondary | ICD-10-CM

## 2014-08-30 HISTORY — PX: CORONARY ARTERY BYPASS GRAFT: SHX141

## 2014-08-30 HISTORY — PX: RADIAL ARTERY HARVEST: SHX5067

## 2014-08-30 HISTORY — PX: INTRAOPERATIVE TRANSESOPHAGEAL ECHOCARDIOGRAM: SHX5062

## 2014-08-30 LAB — POCT I-STAT, CHEM 8
BUN: 16 mg/dL (ref 6–23)
BUN: 13 mg/dL (ref 6–23)
BUN: 14 mg/dL (ref 6–23)
BUN: 13 mg/dL (ref 6–23)
BUN: 13 mg/dL (ref 6–23)
BUN: 12 mg/dL (ref 6–23)
Calcium, Ion: 1.21 mmol/L (ref 1.12–1.23)
Calcium, Ion: 0.97 mmol/L — ABNORMAL LOW (ref 1.12–1.23)
Calcium, Ion: 1.16 mmol/L (ref 1.12–1.23)
Calcium, Ion: 0.97 mmol/L — ABNORMAL LOW (ref 1.12–1.23)
Calcium, Ion: 0.98 mmol/L — ABNORMAL LOW (ref 1.12–1.23)
Calcium, Ion: 1.02 mmol/L — ABNORMAL LOW (ref 1.12–1.23)
Chloride: 100 mEq/L (ref 96–112)
Chloride: 102 mEq/L (ref 96–112)
Chloride: 98 mEq/L (ref 96–112)
Chloride: 98 mEq/L (ref 96–112)
Chloride: 100 mEq/L (ref 96–112)
Chloride: 103 mEq/L (ref 96–112)
Creatinine, Ser: 0.8 mg/dL (ref 0.50–1.35)
Creatinine, Ser: 0.7 mg/dL (ref 0.50–1.35)
Creatinine, Ser: 0.8 mg/dL (ref 0.50–1.35)
Creatinine, Ser: 0.6 mg/dL (ref 0.50–1.35)
Creatinine, Ser: 0.7 mg/dL (ref 0.50–1.35)
Creatinine, Ser: 0.8 mg/dL (ref 0.50–1.35)
Glucose, Bld: 150 mg/dL — ABNORMAL HIGH (ref 70–99)
Glucose, Bld: 134 mg/dL — ABNORMAL HIGH (ref 70–99)
Glucose, Bld: 149 mg/dL — ABNORMAL HIGH (ref 70–99)
Glucose, Bld: 178 mg/dL — ABNORMAL HIGH (ref 70–99)
Glucose, Bld: 185 mg/dL — ABNORMAL HIGH (ref 70–99)
Glucose, Bld: 168 mg/dL — ABNORMAL HIGH (ref 70–99)
HCT: 42 % (ref 39.0–52.0)
HCT: 32 % — ABNORMAL LOW (ref 39.0–52.0)
HCT: 38 % — ABNORMAL LOW (ref 39.0–52.0)
HCT: 26 % — ABNORMAL LOW (ref 39.0–52.0)
HCT: 31 % — ABNORMAL LOW (ref 39.0–52.0)
HCT: 35 % — ABNORMAL LOW (ref 39.0–52.0)
Hemoglobin: 14.3 g/dL (ref 13.0–17.0)
Hemoglobin: 10.9 g/dL — ABNORMAL LOW (ref 13.0–17.0)
Hemoglobin: 12.9 g/dL — ABNORMAL LOW (ref 13.0–17.0)
Hemoglobin: 8.8 g/dL — ABNORMAL LOW (ref 13.0–17.0)
Hemoglobin: 10.5 g/dL — ABNORMAL LOW (ref 13.0–17.0)
Hemoglobin: 11.9 g/dL — ABNORMAL LOW (ref 13.0–17.0)
Potassium: 4.1 mmol/L (ref 3.5–5.1)
Potassium: 4.1 mmol/L (ref 3.5–5.1)
Potassium: 4.4 mmol/L (ref 3.5–5.1)
Potassium: 4.9 mmol/L (ref 3.5–5.1)
Potassium: 4.5 mmol/L (ref 3.5–5.1)
Potassium: 4.3 mmol/L (ref 3.5–5.1)
Sodium: 139 mmol/L (ref 135–145)
Sodium: 138 mmol/L (ref 135–145)
Sodium: 139 mmol/L (ref 135–145)
Sodium: 134 mmol/L — ABNORMAL LOW (ref 135–145)
Sodium: 137 mmol/L (ref 135–145)
Sodium: 141 mmol/L (ref 135–145)
TCO2: 25 mmol/L (ref 0–100)
TCO2: 23 mmol/L (ref 0–100)
TCO2: 23 mmol/L (ref 0–100)
TCO2: 21 mmol/L (ref 0–100)
TCO2: 19 mmol/L (ref 0–100)
TCO2: 19 mmol/L (ref 0–100)

## 2014-08-30 LAB — CBC
HCT: 32.5 % — ABNORMAL LOW (ref 39.0–52.0)
HEMATOCRIT: 37.2 % — AB (ref 39.0–52.0)
HEMOGLOBIN: 12.8 g/dL — AB (ref 13.0–17.0)
Hemoglobin: 11.2 g/dL — ABNORMAL LOW (ref 13.0–17.0)
MCH: 29.5 pg (ref 26.0–34.0)
MCH: 29.6 pg (ref 26.0–34.0)
MCHC: 34.4 g/dL (ref 30.0–36.0)
MCHC: 34.5 g/dL (ref 30.0–36.0)
MCV: 85.7 fL (ref 78.0–100.0)
MCV: 85.8 fL (ref 78.0–100.0)
Platelets: 159 10*3/uL (ref 150–400)
Platelets: 203 10*3/uL (ref 150–400)
RBC: 4.34 MIL/uL (ref 4.22–5.81)
RBC: 3.79 MIL/uL — ABNORMAL LOW (ref 4.22–5.81)
RDW: 12.9 % (ref 11.5–15.5)
RDW: 13.1 % (ref 11.5–15.5)
WBC: 12.8 10*3/uL — AB (ref 4.0–10.5)
WBC: 13.8 10*3/uL — ABNORMAL HIGH (ref 4.0–10.5)

## 2014-08-30 LAB — POCT I-STAT 3, ART BLOOD GAS (G3+)
Acid-Base Excess: 3 mmol/L — ABNORMAL HIGH (ref 0.0–2.0)
Acid-base deficit: 3 mmol/L — ABNORMAL HIGH (ref 0.0–2.0)
Acid-base deficit: 3 mmol/L — ABNORMAL HIGH (ref 0.0–2.0)
Acid-base deficit: 4 mmol/L — ABNORMAL HIGH (ref 0.0–2.0)
Acid-base deficit: 4 mmol/L — ABNORMAL HIGH (ref 0.0–2.0)
Acid-base deficit: 3 mmol/L — ABNORMAL HIGH (ref 0.0–2.0)
Bicarbonate: 28.7 mEq/L — ABNORMAL HIGH (ref 20.0–24.0)
Bicarbonate: 23 mEq/L (ref 20.0–24.0)
Bicarbonate: 22.5 mEq/L (ref 20.0–24.0)
Bicarbonate: 22.1 mEq/L (ref 20.0–24.0)
Bicarbonate: 21.4 mEq/L (ref 20.0–24.0)
Bicarbonate: 22.2 mEq/L (ref 20.0–24.0)
O2 Saturation: 100 %
O2 Saturation: 87 %
O2 Saturation: 99 %
O2 Saturation: 97 %
O2 Saturation: 98 %
O2 Saturation: 94 %
Patient temperature: 36.2
Patient temperature: 36.9
Patient temperature: 37.3
Patient temperature: 37.8
TCO2: 30 mmol/L (ref 0–100)
TCO2: 24 mmol/L (ref 0–100)
TCO2: 24 mmol/L (ref 0–100)
TCO2: 23 mmol/L (ref 0–100)
TCO2: 23 mmol/L (ref 0–100)
TCO2: 23 mmol/L (ref 0–100)
pCO2 arterial: 48.1 mmHg — ABNORMAL HIGH (ref 35.0–45.0)
pCO2 arterial: 44.9 mmHg (ref 35.0–45.0)
pCO2 arterial: 40.5 mmHg (ref 35.0–45.0)
pCO2 arterial: 44.5 mmHg (ref 35.0–45.0)
pCO2 arterial: 42.1 mmHg (ref 35.0–45.0)
pCO2 arterial: 40.7 mmHg (ref 35.0–45.0)
pH, Arterial: 7.384 (ref 7.350–7.450)
pH, Arterial: 7.317 — ABNORMAL LOW (ref 7.350–7.450)
pH, Arterial: 7.348 — ABNORMAL LOW (ref 7.350–7.450)
pH, Arterial: 7.304 — ABNORMAL LOW (ref 7.350–7.450)
pH, Arterial: 7.316 — ABNORMAL LOW (ref 7.350–7.450)
pH, Arterial: 7.349 — ABNORMAL LOW (ref 7.350–7.450)
pO2, Arterial: 329 mmHg — ABNORMAL HIGH (ref 80.0–100.0)
pO2, Arterial: 58 mmHg — ABNORMAL LOW (ref 80.0–100.0)
pO2, Arterial: 132 mmHg — ABNORMAL HIGH (ref 80.0–100.0)
pO2, Arterial: 95 mmHg (ref 80.0–100.0)
pO2, Arterial: 111 mmHg — ABNORMAL HIGH (ref 80.0–100.0)
pO2, Arterial: 79 mmHg — ABNORMAL LOW (ref 80.0–100.0)

## 2014-08-30 LAB — GLUCOSE, CAPILLARY
Glucose-Capillary: 119 mg/dL — ABNORMAL HIGH (ref 70–99)
Glucose-Capillary: 132 mg/dL — ABNORMAL HIGH (ref 70–99)
Glucose-Capillary: 109 mg/dL — ABNORMAL HIGH (ref 70–99)
Glucose-Capillary: 101 mg/dL — ABNORMAL HIGH (ref 70–99)
Glucose-Capillary: 113 mg/dL — ABNORMAL HIGH (ref 70–99)
Glucose-Capillary: 127 mg/dL — ABNORMAL HIGH (ref 70–99)
Glucose-Capillary: 145 mg/dL — ABNORMAL HIGH (ref 70–99)

## 2014-08-30 LAB — CREATININE, SERUM
Creatinine, Ser: 0.98 mg/dL (ref 0.50–1.35)
GFR calc Af Amer: >90 mL/min (ref >90)
GFR calc non Af Amer: >90 mL/min (ref >90)

## 2014-08-30 LAB — PLATELET COUNT: Platelets: 176 10*3/uL (ref 150–400)

## 2014-08-30 LAB — POCT I-STAT 4, (NA,K, GLUC, HGB,HCT)
Glucose, Bld: 155 mg/dL — ABNORMAL HIGH (ref 70–99)
HCT: 38 % — ABNORMAL LOW (ref 39.0–52.0)
Hemoglobin: 12.9 g/dL — ABNORMAL LOW (ref 13.0–17.0)
Potassium: 4 mmol/L (ref 3.5–5.1)
Sodium: 138 mmol/L (ref 135–145)

## 2014-08-30 LAB — POCT I-STAT GLUCOSE
Glucose, Bld: 146 mg/dL — ABNORMAL HIGH (ref 70–99)
Glucose, Bld: 131 mg/dL — ABNORMAL HIGH (ref 70–99)
Operator id: 361721
Operator id: 361721

## 2014-08-30 LAB — APTT: aPTT: 31 seconds (ref 24–37)

## 2014-08-30 LAB — MAGNESIUM: Magnesium: 2.6 mg/dL — ABNORMAL HIGH (ref 1.5–2.5)

## 2014-08-30 LAB — HEMOGLOBIN AND HEMATOCRIT, BLOOD
HCT: 26.9 % — ABNORMAL LOW (ref 39.0–52.0)
Hemoglobin: 9.3 g/dL — ABNORMAL LOW (ref 13.0–17.0)

## 2014-08-30 LAB — PROTIME-INR
INR: 1.23 (ref 0.00–1.49)
Prothrombin Time: 15.6 seconds — ABNORMAL HIGH (ref 11.6–15.2)

## 2014-08-30 SURGERY — CORONARY ARTERY BYPASS GRAFTING (CABG)
Anesthesia: General | Site: Chest

## 2014-08-30 MED ORDER — PHENYLEPHRINE 40 MCG/ML (10ML) SYRINGE FOR IV PUSH (FOR BLOOD PRESSURE SUPPORT)
PREFILLED_SYRINGE | INTRAVENOUS | Status: AC
  Filled 2014-08-30: qty 10

## 2014-08-30 MED ORDER — MIDAZOLAM HCL 10 MG/2ML IJ SOLN
INTRAMUSCULAR | Status: AC
  Filled 2014-08-30: qty 2

## 2014-08-30 MED ORDER — ROCURONIUM BROMIDE 100 MG/10ML IV SOLN
INTRAVENOUS | Status: DC | PRN
  Administered 2014-08-30 (×5): 50 mg via INTRAVENOUS
  Administered 2014-08-30: 100 mg via INTRAVENOUS

## 2014-08-30 MED ORDER — MUPIROCIN 2 % EX OINT
1.0000 "application " | TOPICAL_OINTMENT | Freq: Two times a day (BID) | CUTANEOUS | Status: DC
  Administered 2014-08-30 – 2014-09-04 (×9): 1 via NASAL
  Filled 2014-08-30 (×2): qty 22

## 2014-08-30 MED ORDER — ROCURONIUM BROMIDE 50 MG/5ML IV SOLN
INTRAVENOUS | Status: AC
  Filled 2014-08-30: qty 3

## 2014-08-30 MED ORDER — PROTAMINE SULFATE 10 MG/ML IV SOLN
INTRAVENOUS | Status: AC
  Filled 2014-08-30: qty 50

## 2014-08-30 MED ORDER — TRAMADOL HCL 50 MG PO TABS
50.0000 mg | ORAL_TABLET | ORAL | Status: DC | PRN
  Administered 2014-08-31: 100 mg via ORAL
  Administered 2014-08-31: 50 mg via ORAL
  Administered 2014-09-01 – 2014-09-04 (×4): 100 mg via ORAL
  Filled 2014-08-30 (×5): qty 2
  Filled 2014-08-30: qty 1

## 2014-08-30 MED ORDER — ACETAMINOPHEN 650 MG RE SUPP
650.0000 mg | Freq: Once | RECTAL | Status: AC
  Administered 2014-08-30: 650 mg via RECTAL

## 2014-08-30 MED ORDER — METOPROLOL TARTRATE 12.5 MG HALF TABLET
12.5000 mg | ORAL_TABLET | Freq: Two times a day (BID) | ORAL | Status: DC
  Administered 2014-08-31 (×2): 12.5 mg via ORAL
  Filled 2014-08-30 (×5): qty 1

## 2014-08-30 MED ORDER — CHLORHEXIDINE GLUCONATE 0.12 % MT SOLN
15.0000 mL | Freq: Two times a day (BID) | OROMUCOSAL | Status: DC
  Administered 2014-08-30 – 2014-08-31 (×3): 15 mL via OROMUCOSAL
  Filled 2014-08-30 (×3): qty 15

## 2014-08-30 MED ORDER — SODIUM CHLORIDE 0.9 % IJ SOLN
3.0000 mL | INTRAMUSCULAR | Status: DC | PRN
  Administered 2014-08-31: 3 mL via INTRAVENOUS
  Filled 2014-08-30: qty 3

## 2014-08-30 MED ORDER — FENTANYL CITRATE 0.05 MG/ML IJ SOLN
INTRAMUSCULAR | Status: AC
  Filled 2014-08-30: qty 5

## 2014-08-30 MED ORDER — DEXTROSE 5 % IV SOLN
0.0000 ug/min | INTRAVENOUS | Status: DC
  Administered 2014-08-30: 20 ug/min via INTRAVENOUS
  Filled 2014-08-30: qty 2

## 2014-08-30 MED ORDER — PANTOPRAZOLE SODIUM 40 MG PO TBEC
40.0000 mg | DELAYED_RELEASE_TABLET | Freq: Every day | ORAL | Status: DC
  Administered 2014-09-01 – 2014-09-04 (×4): 40 mg via ORAL
  Filled 2014-08-30 (×3): qty 1

## 2014-08-30 MED ORDER — ACETAMINOPHEN 160 MG/5ML PO SOLN
1000.0000 mg | Freq: Four times a day (QID) | ORAL | Status: DC
  Filled 2014-08-30: qty 40

## 2014-08-30 MED ORDER — FENTANYL CITRATE 0.05 MG/ML IJ SOLN
INTRAMUSCULAR | Status: DC | PRN
  Administered 2014-08-30 (×2): 150 ug via INTRAVENOUS
  Administered 2014-08-30: 250 ug via INTRAVENOUS
  Administered 2014-08-30: 100 ug via INTRAVENOUS
  Administered 2014-08-30: 150 ug via INTRAVENOUS
  Administered 2014-08-30: 200 ug via INTRAVENOUS
  Administered 2014-08-30: 100 ug via INTRAVENOUS
  Administered 2014-08-30: 150 ug via INTRAVENOUS

## 2014-08-30 MED ORDER — MORPHINE SULFATE 2 MG/ML IJ SOLN
INTRAMUSCULAR | Status: AC
  Administered 2014-08-30: 4 mg via INTRAMUSCULAR
  Filled 2014-08-30: qty 2

## 2014-08-30 MED ORDER — SODIUM CHLORIDE 0.9 % IV SOLN
250.0000 [IU] | INTRAVENOUS | Status: DC | PRN
  Administered 2014-08-30: 1 [IU]/h via INTRAVENOUS

## 2014-08-30 MED ORDER — MIDAZOLAM HCL 2 MG/2ML IJ SOLN: 2.0000 mg | INTRAMUSCULAR | Status: DC | PRN

## 2014-08-30 MED ORDER — ROCURONIUM BROMIDE 50 MG/5ML IV SOLN
INTRAVENOUS | Status: AC
  Filled 2014-08-30: qty 1

## 2014-08-30 MED ORDER — LIDOCAINE HCL (CARDIAC) 20 MG/ML IV SOLN
INTRAVENOUS | Status: AC
  Filled 2014-08-30: qty 5

## 2014-08-30 MED ORDER — DEXMEDETOMIDINE HCL IN NACL 200 MCG/50ML IV SOLN
0.0000 ug/kg/h | INTRAVENOUS | Status: DC
  Administered 2014-08-30: 0.7 ug/kg/h via INTRAVENOUS
  Filled 2014-08-30: qty 50

## 2014-08-30 MED ORDER — HEPARIN SODIUM (PORCINE) 1000 UNIT/ML IJ SOLN
INTRAMUSCULAR | Status: AC
  Filled 2014-08-30: qty 1

## 2014-08-30 MED ORDER — DOCUSATE SODIUM 100 MG PO CAPS
200.0000 mg | ORAL_CAPSULE | Freq: Every day | ORAL | Status: DC
  Administered 2014-08-31 – 2014-09-01 (×2): 200 mg via ORAL
  Filled 2014-08-30 (×3): qty 2

## 2014-08-30 MED ORDER — MORPHINE SULFATE 2 MG/ML IJ SOLN
1.0000 mg | INTRAMUSCULAR | Status: AC | PRN
  Administered 2014-08-30 (×2): 2 mg via INTRAVENOUS

## 2014-08-30 MED ORDER — SODIUM CHLORIDE 0.9 % IV SOLN
INTRAVENOUS | Status: DC
  Administered 2014-08-30: 20 mL/h via INTRAVENOUS

## 2014-08-30 MED ORDER — SODIUM CHLORIDE 0.9 % IV SOLN
INTRAVENOUS | Status: DC
  Administered 2014-08-30: 5.7 [IU]/h via INTRAVENOUS
  Filled 2014-08-30 (×2): qty 2.5

## 2014-08-30 MED ORDER — AMINOCAPROIC ACID 250 MG/ML IV SOLN
10.0000 g | INTRAVENOUS | Status: DC | PRN
  Administered 2014-08-30: 5 g/h via INTRAVENOUS

## 2014-08-30 MED ORDER — PHENYLEPHRINE HCL 10 MG/ML IJ SOLN
INTRAMUSCULAR | Status: DC | PRN
  Administered 2014-08-30 (×2): 60 ug via INTRAVENOUS
  Administered 2014-08-30: 40 ug via INTRAVENOUS

## 2014-08-30 MED ORDER — BISACODYL 10 MG RE SUPP: 10.0000 mg | Freq: Every day | RECTAL | Status: DC

## 2014-08-30 MED ORDER — ACETAMINOPHEN 500 MG PO TABS
1000.0000 mg | ORAL_TABLET | Freq: Four times a day (QID) | ORAL | Status: DC
  Administered 2014-08-31 – 2014-09-03 (×16): 1000 mg via ORAL
  Filled 2014-08-30 (×21): qty 2

## 2014-08-30 MED ORDER — METOPROLOL TARTRATE 25 MG/10 ML ORAL SUSPENSION
12.5000 mg | Freq: Two times a day (BID) | ORAL | Status: DC
  Filled 2014-08-30 (×5): qty 5

## 2014-08-30 MED ORDER — POTASSIUM CHLORIDE 10 MEQ/50ML IV SOLN
10.0000 meq | INTRAVENOUS | Status: AC
  Administered 2014-08-30 (×2): 10 meq via INTRAVENOUS

## 2014-08-30 MED ORDER — LACTATED RINGERS IV SOLN
INTRAVENOUS | Status: DC | PRN
  Administered 2014-08-30: 07:00:00 via INTRAVENOUS

## 2014-08-30 MED ORDER — SODIUM CHLORIDE 0.45 % IV SOLN
INTRAVENOUS | Status: DC
  Administered 2014-08-30: 20 mL/h via INTRAVENOUS

## 2014-08-30 MED ORDER — SODIUM CHLORIDE 0.9 % IJ SOLN
INTRAMUSCULAR | Status: AC
  Filled 2014-08-30: qty 20

## 2014-08-30 MED ORDER — PROPOFOL 10 MG/ML IV BOLUS
INTRAVENOUS | Status: AC
  Filled 2014-08-30: qty 20

## 2014-08-30 MED ORDER — SODIUM CHLORIDE 0.9 % IV SOLN
200.0000 ug | INTRAVENOUS | Status: DC | PRN
  Administered 2014-08-30: .2 ug/kg/h via INTRAVENOUS

## 2014-08-30 MED ORDER — VANCOMYCIN HCL IN DEXTROSE 1-5 GM/200ML-% IV SOLN
1000.0000 mg | Freq: Once | INTRAVENOUS | Status: AC
  Administered 2014-08-30: 1000 mg via INTRAVENOUS
  Filled 2014-08-30: qty 200

## 2014-08-30 MED ORDER — SODIUM CHLORIDE 0.9 % IJ SOLN
OROMUCOSAL | Status: DC | PRN
  Administered 2014-08-30 (×3): 1000 mL via TOPICAL

## 2014-08-30 MED ORDER — LACTATED RINGERS IV SOLN: 500.0000 mL | Freq: Once | INTRAVENOUS | Status: AC | PRN

## 2014-08-30 MED ORDER — PROPOFOL 10 MG/ML IV BOLUS
INTRAVENOUS | Status: DC | PRN
  Administered 2014-08-30: 200 mg via INTRAVENOUS

## 2014-08-30 MED ORDER — BISACODYL 5 MG PO TBEC
10.0000 mg | DELAYED_RELEASE_TABLET | Freq: Every day | ORAL | Status: DC
  Administered 2014-08-31 – 2014-09-01 (×2): 10 mg via ORAL
  Filled 2014-08-30 (×2): qty 2

## 2014-08-30 MED ORDER — CETYLPYRIDINIUM CHLORIDE 0.05 % MT LIQD
7.0000 mL | Freq: Four times a day (QID) | OROMUCOSAL | Status: DC
  Administered 2014-08-31: 7 mL via OROMUCOSAL

## 2014-08-30 MED ORDER — NITROGLYCERIN IN D5W 200-5 MCG/ML-% IV SOLN
INTRAVENOUS | Status: DC | PRN
Start: 2014-08-30 — End: 2014-08-30
  Administered 2014-08-30: 5 ug/min via INTRAVENOUS

## 2014-08-30 MED ORDER — HEMOSTATIC AGENTS (NO CHARGE) OPTIME
TOPICAL | Status: DC | PRN
  Administered 2014-08-30: 1 via TOPICAL

## 2014-08-30 MED ORDER — FAMOTIDINE IN NACL 20-0.9 MG/50ML-% IV SOLN
20.0000 mg | Freq: Two times a day (BID) | INTRAVENOUS | Status: AC
  Administered 2014-08-30 – 2014-08-31 (×2): 20 mg via INTRAVENOUS
  Filled 2014-08-30: qty 50

## 2014-08-30 MED ORDER — MIDAZOLAM HCL 5 MG/5ML IJ SOLN
INTRAMUSCULAR | Status: DC | PRN
  Administered 2014-08-30 (×2): 2 mg via INTRAVENOUS
  Administered 2014-08-30: 5 mg via INTRAVENOUS
  Administered 2014-08-30: 2 mg via INTRAVENOUS
  Administered 2014-08-30: 4 mg via INTRAVENOUS
  Administered 2014-08-30: 5 mg via INTRAVENOUS

## 2014-08-30 MED ORDER — OXYCODONE HCL 5 MG PO TABS
5.0000 mg | ORAL_TABLET | ORAL | Status: DC | PRN
  Administered 2014-08-31: 10 mg via ORAL
  Filled 2014-08-30 (×2): qty 2

## 2014-08-30 MED ORDER — SODIUM CHLORIDE 0.9 % IJ SOLN
INTRAMUSCULAR | Status: AC
  Filled 2014-08-30: qty 10

## 2014-08-30 MED ORDER — DOPAMINE-DEXTROSE 3.2-5 MG/ML-% IV SOLN: 0.0000 ug/kg/min | INTRAVENOUS | Status: DC

## 2014-08-30 MED ORDER — MORPHINE SULFATE 2 MG/ML IJ SOLN
2.0000 mg | INTRAMUSCULAR | Status: DC | PRN
  Administered 2014-08-30: 2 mg via INTRAVENOUS
  Administered 2014-08-31: 4 mg via INTRAVENOUS
  Administered 2014-08-31: 2 mg via INTRAVENOUS
  Administered 2014-08-31: 4 mg via INTRAVENOUS
  Administered 2014-08-31: 2 mg via INTRAVENOUS
  Administered 2014-08-31: 4 mg via INTRAVENOUS
  Filled 2014-08-30 (×2): qty 2
  Filled 2014-08-30 (×3): qty 1
  Filled 2014-08-30 (×2): qty 2
  Filled 2014-08-30: qty 1

## 2014-08-30 MED ORDER — PROTAMINE SULFATE 10 MG/ML IV SOLN
INTRAVENOUS | Status: DC | PRN
  Administered 2014-08-30: 110 mg via INTRAVENOUS
  Administered 2014-08-30: 100 mg via INTRAVENOUS
  Administered 2014-08-30: 10 mg via INTRAVENOUS
  Administered 2014-08-30: 100 mg via INTRAVENOUS

## 2014-08-30 MED ORDER — DEXTROSE 5 % IV SOLN
1.5000 g | Freq: Two times a day (BID) | INTRAVENOUS | Status: AC
  Administered 2014-08-30 – 2014-09-01 (×4): 1.5 g via INTRAVENOUS
  Filled 2014-08-30 (×4): qty 1.5

## 2014-08-30 MED ORDER — LACTATED RINGERS IV SOLN
INTRAVENOUS | Status: DC | PRN
  Administered 2014-08-30 (×2): via INTRAVENOUS

## 2014-08-30 MED ORDER — NITROGLYCERIN IN D5W 200-5 MCG/ML-% IV SOLN
0.0000 ug/min | INTRAVENOUS | Status: DC
  Administered 2014-08-30: 5 ug/min via INTRAVENOUS

## 2014-08-30 MED ORDER — EPHEDRINE SULFATE 50 MG/ML IJ SOLN
INTRAMUSCULAR | Status: AC
  Filled 2014-08-30: qty 1

## 2014-08-30 MED ORDER — ALBUTEROL SULFATE HFA 108 (90 BASE) MCG/ACT IN AERS
INHALATION_SPRAY | RESPIRATORY_TRACT | Status: DC | PRN
  Administered 2014-08-30: 4 via RESPIRATORY_TRACT

## 2014-08-30 MED ORDER — ROCURONIUM BROMIDE 50 MG/5ML IV SOLN
INTRAVENOUS | Status: AC
  Filled 2014-08-30: qty 2

## 2014-08-30 MED ORDER — PROTAMINE SULFATE 10 MG/ML IV SOLN
INTRAVENOUS | Status: AC
  Filled 2014-08-30: qty 10

## 2014-08-30 MED ORDER — ACETAMINOPHEN 160 MG/5ML PO SOLN: 650.0000 mg | Freq: Once | ORAL | Status: AC

## 2014-08-30 MED ORDER — CHLORHEXIDINE GLUCONATE CLOTH 2 % EX PADS
6.0000 | MEDICATED_PAD | Freq: Every day | CUTANEOUS | Status: AC
  Administered 2014-08-30 – 2014-09-03 (×5): 6 via TOPICAL

## 2014-08-30 MED ORDER — DEXMEDETOMIDINE HCL IN NACL 200 MCG/50ML IV SOLN
INTRAVENOUS | Status: AC
  Filled 2014-08-30: qty 50

## 2014-08-30 MED ORDER — ALBUMIN HUMAN 5 % IV SOLN
250.0000 mL | INTRAVENOUS | Status: AC | PRN
  Administered 2014-08-30 (×3): 250 mL via INTRAVENOUS
  Filled 2014-08-30: qty 250

## 2014-08-30 MED ORDER — ASPIRIN 81 MG PO CHEW: 324.0000 mg | CHEWABLE_TABLET | Freq: Every day | ORAL | Status: DC

## 2014-08-30 MED ORDER — HEPARIN SODIUM (PORCINE) 1000 UNIT/ML IJ SOLN
INTRAMUSCULAR | Status: DC | PRN
  Administered 2014-08-30: 2 mL via INTRAVENOUS
  Administered 2014-08-30: 34 mL via INTRAVENOUS
  Administered 2014-08-30: 4 mL via INTRAVENOUS

## 2014-08-30 MED ORDER — ASPIRIN EC 325 MG PO TBEC
325.0000 mg | DELAYED_RELEASE_TABLET | Freq: Every day | ORAL | Status: DC
  Administered 2014-08-31 – 2014-09-04 (×5): 325 mg via ORAL
  Filled 2014-08-30 (×5): qty 1

## 2014-08-30 MED ORDER — 0.9 % SODIUM CHLORIDE (POUR BTL) OPTIME
TOPICAL | Status: DC | PRN
  Administered 2014-08-30: 6000 mL

## 2014-08-30 MED ORDER — ONDANSETRON HCL 4 MG/2ML IJ SOLN
4.0000 mg | Freq: Four times a day (QID) | INTRAMUSCULAR | Status: DC | PRN
  Administered 2014-08-31 – 2014-09-01 (×2): 4 mg via INTRAVENOUS
  Filled 2014-08-30 (×2): qty 2

## 2014-08-30 MED ORDER — SODIUM CHLORIDE 0.9 % IJ SOLN
3.0000 mL | Freq: Two times a day (BID) | INTRAMUSCULAR | Status: DC
Start: 2014-08-31 — End: 2014-09-04
  Administered 2014-08-31 – 2014-09-03 (×3): 3 mL via INTRAVENOUS

## 2014-08-30 MED ORDER — LACTATED RINGERS IV SOLN
INTRAVENOUS | Status: DC | PRN
  Administered 2014-08-30: 08:00:00 via INTRAVENOUS

## 2014-08-30 MED ORDER — LACTATED RINGERS IV SOLN
INTRAVENOUS | Status: DC
  Administered 2014-08-30: 20 mL/h via INTRAVENOUS

## 2014-08-30 MED ORDER — MAGNESIUM SULFATE 4 GM/100ML IV SOLN
4.0000 g | Freq: Once | INTRAVENOUS | Status: AC
  Administered 2014-08-30: 4 g via INTRAVENOUS

## 2014-08-30 MED ORDER — SODIUM CHLORIDE 0.9 % IV SOLN: 250.0000 mL | INTRAVENOUS | Status: DC

## 2014-08-30 MED ORDER — INSULIN REGULAR BOLUS VIA INFUSION
0.0000 [IU] | Freq: Three times a day (TID) | INTRAVENOUS | Status: DC
  Filled 2014-08-30: qty 10

## 2014-08-30 MED ORDER — MAGNESIUM SULFATE 4 GM/100ML IV SOLN
INTRAVENOUS | Status: AC
  Filled 2014-08-30: qty 100

## 2014-08-30 MED ORDER — PHENYLEPHRINE HCL 10 MG/ML IJ SOLN
10.0000 mg | INTRAVENOUS | Status: DC | PRN
  Administered 2014-08-30: 40 ug/min via INTRAVENOUS

## 2014-08-30 MED ORDER — METOPROLOL TARTRATE 1 MG/ML IV SOLN: 2.5000 mg | INTRAVENOUS | Status: DC | PRN

## 2014-08-30 MED ORDER — SODIUM BICARBONATE 8.4 % IV SOLN
50.0000 meq | Freq: Once | INTRAVENOUS | Status: AC
  Administered 2014-08-30: 50 meq via INTRAVENOUS
  Filled 2014-08-30: qty 50

## 2014-08-30 MED FILL — Sodium Bicarbonate IV Soln 8.4%: INTRAVENOUS | Qty: 50 | Status: AC

## 2014-08-30 MED FILL — Mannitol IV Soln 20%: INTRAVENOUS | Qty: 500 | Status: AC

## 2014-08-30 MED FILL — Albumin, Human Inj 5%: INTRAVENOUS | Qty: 250 | Status: AC

## 2014-08-30 MED FILL — Electrolyte-R (PH 7.4) Solution: INTRAVENOUS | Qty: 4000 | Status: AC

## 2014-08-30 MED FILL — Lidocaine HCl IV Inj 20 MG/ML: INTRAVENOUS | Qty: 10 | Status: AC

## 2014-08-30 MED FILL — Heparin Sodium (Porcine) Inj 1000 Unit/ML: INTRAMUSCULAR | Qty: 20 | Status: AC

## 2014-08-30 MED FILL — Sodium Chloride IV Soln 0.9%: INTRAVENOUS | Qty: 2000 | Status: AC

## 2014-08-30 SURGICAL SUPPLY — 123 items
ADAPTER CARDIO PERF ANTE/RETRO (ADAPTER) ×5 IMPLANT
APPLIER CLIP 9.375 SM OPEN (CLIP) ×5
ATTRACTOMAT 16X20 MAGNETIC DRP (DRAPES) ×5 IMPLANT
BAG DECANTER FOR FLEXI CONT (MISCELLANEOUS) ×5 IMPLANT
BANDAGE ELASTIC 4 VELCRO ST LF (GAUZE/BANDAGES/DRESSINGS) ×10 IMPLANT
BANDAGE ELASTIC 6 VELCRO ST LF (GAUZE/BANDAGES/DRESSINGS) ×10 IMPLANT
BASKET HEART  (ORDER IN 25'S) (MISCELLANEOUS) ×1
BASKET HEART (ORDER IN 25'S) (MISCELLANEOUS) ×1
BASKET HEART (ORDER IN 25S) (MISCELLANEOUS) ×3 IMPLANT
BLADE STERNUM SYSTEM 6 (BLADE) ×5 IMPLANT
BLADE SURG 11 STRL SS (BLADE) ×5 IMPLANT
BLADE SURG 12 STRL SS (BLADE) ×5 IMPLANT
BLADE SURG 15 STRL LF DISP TIS (BLADE) ×3 IMPLANT
BLADE SURG 15 STRL SS (BLADE) ×2
BLADE SURG ROTATE 9660 (MISCELLANEOUS) IMPLANT
BNDG GAUZE ELAST 4 BULKY (GAUZE/BANDAGES/DRESSINGS) ×10 IMPLANT
CANISTER SUCTION 2500CC (MISCELLANEOUS) ×5 IMPLANT
CANNULA ARTERIAL NVNT 3/8 22FR (MISCELLANEOUS) ×5 IMPLANT
CANNULA GUNDRY RCSP 15FR (MISCELLANEOUS) ×5 IMPLANT
CATH CPB KIT VANTRIGT (MISCELLANEOUS) ×5 IMPLANT
CATH ROBINSON RED A/P 18FR (CATHETERS) ×15 IMPLANT
CATH THORACIC 36FR RT ANG (CATHETERS) ×5 IMPLANT
CLIP APPLIE 9.375 SM OPEN (CLIP) ×3 IMPLANT
CLIP RETRACTION 3.0MM CORONARY (MISCELLANEOUS) ×5 IMPLANT
CLIP TI MEDIUM 24 (CLIP) IMPLANT
CLIP TI WIDE RED SMALL 24 (CLIP) IMPLANT
COVER MAYO STAND STRL (DRAPES) ×5 IMPLANT
COVER SURGICAL LIGHT HANDLE (MISCELLANEOUS) ×5 IMPLANT
CRADLE DONUT ADULT HEAD (MISCELLANEOUS) ×5 IMPLANT
DERMABOND ADVANCED (GAUZE/BANDAGES/DRESSINGS) ×2
DERMABOND ADVANCED .7 DNX12 (GAUZE/BANDAGES/DRESSINGS) ×3 IMPLANT
DRAIN CHANNEL 32F RND 10.7 FF (WOUND CARE) ×5 IMPLANT
DRAPE CARDIOVASCULAR INCISE (DRAPES) ×2
DRAPE EXTREMITY T 121X128X90 (DRAPE) ×5 IMPLANT
DRAPE PROXIMA HALF (DRAPES) ×5 IMPLANT
DRAPE SLUSH/WARMER DISC (DRAPES) ×5 IMPLANT
DRAPE SRG 135X102X78XABS (DRAPES) ×3 IMPLANT
DRSG AQUACEL AG ADV 3.5X14 (GAUZE/BANDAGES/DRESSINGS) ×5 IMPLANT
DRSG COVADERM 4X14 (GAUZE/BANDAGES/DRESSINGS) ×5 IMPLANT
ELECT BLADE 4.0 EZ CLEAN MEGAD (MISCELLANEOUS) ×5
ELECT BLADE 6.5 EXT (BLADE) ×5 IMPLANT
ELECT CAUTERY BLADE 6.4 (BLADE) ×5 IMPLANT
ELECT REM PT RETURN 9FT ADLT (ELECTROSURGICAL) ×10
ELECTRODE BLDE 4.0 EZ CLN MEGD (MISCELLANEOUS) ×3 IMPLANT
ELECTRODE REM PT RTRN 9FT ADLT (ELECTROSURGICAL) ×6 IMPLANT
GAUZE SPONGE 4X4 12PLY STRL (GAUZE/BANDAGES/DRESSINGS) ×10 IMPLANT
GEL ULTRASOUND 20GR AQUASONIC (MISCELLANEOUS) ×5 IMPLANT
GLOVE BIO SURGEON STRL SZ 6 (GLOVE) ×5 IMPLANT
GLOVE BIO SURGEON STRL SZ 6.5 (GLOVE) ×12 IMPLANT
GLOVE BIO SURGEON STRL SZ7.5 (GLOVE) ×15 IMPLANT
GLOVE BIO SURGEONS STRL SZ 6.5 (GLOVE) ×3
GLOVE BIOGEL PI IND STRL 6.5 (GLOVE) ×6 IMPLANT
GLOVE BIOGEL PI IND STRL 7.5 (GLOVE) ×6 IMPLANT
GLOVE BIOGEL PI INDICATOR 6.5 (GLOVE) ×4
GLOVE BIOGEL PI INDICATOR 7.5 (GLOVE) ×4
GOWN STRL REUS W/ TWL LRG LVL3 (GOWN DISPOSABLE) ×21 IMPLANT
GOWN STRL REUS W/TWL LRG LVL3 (GOWN DISPOSABLE) ×14
HARMONIC SHEARS 14CM COAG (MISCELLANEOUS) ×10 IMPLANT
HEMOSTAT POWDER SURGIFOAM 1G (HEMOSTASIS) ×15 IMPLANT
HEMOSTAT SURGICEL 2X14 (HEMOSTASIS) ×5 IMPLANT
INSERT FOGARTY XLG (MISCELLANEOUS) ×5 IMPLANT
KIT BASIN OR (CUSTOM PROCEDURE TRAY) ×5 IMPLANT
KIT ROOM TURNOVER OR (KITS) ×5 IMPLANT
KIT SUCTION CATH 14FR (SUCTIONS) ×5 IMPLANT
KIT VASOVIEW W/TROCAR VH 2000 (KITS) ×5 IMPLANT
LEAD PACING MYOCARDI (MISCELLANEOUS) ×5 IMPLANT
MARKER GRAFT CORONARY BYPASS (MISCELLANEOUS) ×15 IMPLANT
NS IRRIG 1000ML POUR BTL (IV SOLUTION) ×25 IMPLANT
PACK OPEN HEART (CUSTOM PROCEDURE TRAY) ×5 IMPLANT
PAD ARMBOARD 7.5X6 YLW CONV (MISCELLANEOUS) ×10 IMPLANT
PAD ELECT DEFIB RADIOL ZOLL (MISCELLANEOUS) ×5 IMPLANT
PENCIL BUTTON HOLSTER BLD 10FT (ELECTRODE) ×5 IMPLANT
PUNCH AORTIC ROTATE 4.0MM (MISCELLANEOUS) IMPLANT
PUNCH AORTIC ROTATE 4.5MM 8IN (MISCELLANEOUS) ×5 IMPLANT
PUNCH AORTIC ROTATE 5MM 8IN (MISCELLANEOUS) IMPLANT
SET CARDIOPLEGIA MPS 5001102 (MISCELLANEOUS) ×5 IMPLANT
SPOGE SURGIFLO 8M (HEMOSTASIS) ×2
SPONGE GAUZE 4X4 12PLY STER LF (GAUZE/BANDAGES/DRESSINGS) ×15 IMPLANT
SPONGE LAP 18X18 X RAY DECT (DISPOSABLE) ×10 IMPLANT
SPONGE LAP 4X18 X RAY DECT (DISPOSABLE) ×5 IMPLANT
SPONGE SURGIFLO 8M (HEMOSTASIS) ×3 IMPLANT
STAPLER VISISTAT 35W (STAPLE) ×5 IMPLANT
SURGIFLO W/THROMBIN 8M KIT (HEMOSTASIS) ×5 IMPLANT
SUT BONE WAX W31G (SUTURE) ×5 IMPLANT
SUT MNCRL AB 4-0 PS2 18 (SUTURE) IMPLANT
SUT PROLENE 3 0 SH DA (SUTURE) IMPLANT
SUT PROLENE 3 0 SH1 36 (SUTURE) IMPLANT
SUT PROLENE 4 0 RB 1 (SUTURE) ×2
SUT PROLENE 4 0 SH DA (SUTURE) ×5 IMPLANT
SUT PROLENE 4-0 RB1 .5 CRCL 36 (SUTURE) ×3 IMPLANT
SUT PROLENE 5 0 C 1 36 (SUTURE) IMPLANT
SUT PROLENE 6 0 C 1 30 (SUTURE) ×5 IMPLANT
SUT PROLENE 6 0 CC (SUTURE) ×15 IMPLANT
SUT PROLENE 7.0 RB 3 (SUTURE) ×5 IMPLANT
SUT PROLENE 8 0 BV175 6 (SUTURE) ×10 IMPLANT
SUT PROLENE BLUE 7 0 (SUTURE) ×5 IMPLANT
SUT SILK  1 MH (SUTURE)
SUT SILK 1 MH (SUTURE) IMPLANT
SUT SILK 2 0 SH CR/8 (SUTURE) ×5 IMPLANT
SUT SILK 3 0 SH CR/8 (SUTURE) IMPLANT
SUT STEEL 6MS V (SUTURE) ×10 IMPLANT
SUT STEEL SZ 6 DBL 3X14 BALL (SUTURE) ×5 IMPLANT
SUT VIC AB 1 CTX 18 (SUTURE) ×5 IMPLANT
SUT VIC AB 1 CTX 36 (SUTURE) ×4
SUT VIC AB 1 CTX36XBRD ANBCTR (SUTURE) ×6 IMPLANT
SUT VIC AB 2-0 CT1 27 (SUTURE) ×6
SUT VIC AB 2-0 CT1 TAPERPNT 27 (SUTURE) ×9 IMPLANT
SUT VIC AB 2-0 CTX 27 (SUTURE) IMPLANT
SUT VIC AB 3-0 SH 27 (SUTURE)
SUT VIC AB 3-0 SH 27X BRD (SUTURE) IMPLANT
SUT VIC AB 3-0 X1 27 (SUTURE) ×5 IMPLANT
SUTURE E-PAK OPEN HEART (SUTURE) ×5 IMPLANT
SYR 50ML SLIP (SYRINGE) IMPLANT
SYSTEM SAHARA CHEST DRAIN ATS (WOUND CARE) ×5 IMPLANT
TAPE CLOTH SURG 4X10 WHT LF (GAUZE/BANDAGES/DRESSINGS) ×5 IMPLANT
TAPE PAPER 2X10 WHT MICROPORE (GAUZE/BANDAGES/DRESSINGS) ×5 IMPLANT
TOWEL OR 17X24 6PK STRL BLUE (TOWEL DISPOSABLE) ×15 IMPLANT
TOWEL OR 17X26 10 PK STRL BLUE (TOWEL DISPOSABLE) ×10 IMPLANT
TRAY FOLEY IC TEMP SENS 16FR (CATHETERS) ×5 IMPLANT
TUBING INSUFFLATION (TUBING) ×5 IMPLANT
UNDERPAD 30X30 INCONTINENT (UNDERPADS AND DIAPERS) ×5 IMPLANT
WATER STERILE IRR 1000ML POUR (IV SOLUTION) ×10 IMPLANT
YANKAUER SUCT BULB TIP NO VENT (SUCTIONS) ×5 IMPLANT

## 2014-08-30 NOTE — Anesthesia Preprocedure Evaluation (Signed)
Anesthesia Evaluation  Patient identified by MRN, date of birth, ID band Patient awake    Reviewed: Allergy & Precautions, NPO status , Patient's Chart, lab work & pertinent test results  Airway Mallampati: II   Neck ROM: full    Dental   Pulmonary neg pulmonary ROS,          Cardiovascular hypertension, + angina + CAD and + Past MI     Neuro/Psych    GI/Hepatic   Endo/Other  diabetes, Type 2obese  Renal/GU      Musculoskeletal   Abdominal   Peds  Hematology   Anesthesia Other Findings   Reproductive/Obstetrics                             Anesthesia Physical Anesthesia Plan  ASA: III  Anesthesia Plan: General   Post-op Pain Management:    Induction: Intravenous  Airway Management Planned: Oral ETT  Additional Equipment: Arterial line, CVP, PA Cath, TEE and Ultrasound Guidance Line Placement  Intra-op Plan:   Post-operative Plan: Post-operative intubation/ventilation  Informed Consent: I have reviewed the patients History and Physical, chart, labs and discussed the procedure including the risks, benefits and alternatives for the proposed anesthesia with the patient or authorized representative who has indicated his/her understanding and acceptance.     Plan Discussed with: CRNA, Anesthesiologist and Surgeon  Anesthesia Plan Comments:         Anesthesia Quick Evaluation

## 2014-08-30 NOTE — Brief Op Note (Signed)
08/27/2014 - 08/30/2014      [address]       [place] [id number]             [phone]     08/27/2014 - 08/30/2014  12:47 PM  PATIENT:  Earl Riddle  49 y.o. male  PRE-OPERATIVE DIAGNOSIS:  CORONARY ARTERY DISEASE  POST-OPERATIVE DIAGNOSIS:  CORONARY ARTERY DISEASE  PROCEDURE:  Procedure(s): CORONARY ARTERY BYPASS GRAFTING (CABG)X4 LIMA-LAD; LEFT RADIAL-PD; SEQ SVG-OM1-OM2 INTRAOPERATIVE TRANSESOPHAGEAL ECHOCARDIOGRAM LEFT RADIAL ARTERY HARVEST EVH RIGHT THIGH  SURGEON:  Surgeon(s): Kerin PernaPeter Van Trigt, MD  PHYSICIAN ASSISTANT: WAYNE GOLD PA-C  ANESTHESIA:   general  PATIENT CONDITION:  ICU - intubated and hemodynamically stable.  PRE-OPERATIVE WEIGHT: 129kg  EBL: SEE ANEST/PERFUSION RECORDS  COMPLICATIONS: NO KNOWN

## 2014-08-30 NOTE — OR Nursing (Signed)
14:10- Second call to SICU.

## 2014-08-30 NOTE — OR Nursing (Signed)
13:30 - 1st call to SICU charge nurse.

## 2014-08-30 NOTE — Progress Notes (Signed)
The patient was examined and preop studies reviewed. There has been no change from the prior exam and the patient is ready for surgery.  Plan CABG on [employer]

## 2014-08-30 NOTE — Procedures (Signed)
Extubation Procedure Note  Patient Details:   Name: Earl Riddle DOB: 05-Apr-1966 MRN: [id number]   Airway Documentation:  Pre extubation: Pt completed Rapid Wean Protocol without complication. ABG in acceptable range. NIF -30, VC 1.2L. Cuff leak present. Pt following all commands.   Post extubation: Pt extubated to 2L [place]. Sats 97%. Pt able to speak name/location. Pt coughs and clears secretion. Clear BBS. Vitals stable.  IS 750mls Evaluation  O2 sats: stable throughout Complications: No apparent complications Patient did tolerate procedure well. Bilateral Breath Sounds: Coarse crackles   Yes  Elmer PickerClifton, Rhonda Vangieson D 08/30/2014, 10:14 PM

## 2014-08-30 NOTE — Transfer of Care (Signed)
Immediate Anesthesia Transfer of Care Note  Patient: Earl HopesJeffrey E Frazee  Procedure(s) Performed: Procedure(s): CORONARY ARTERY BYPASS GRAFTING (CABG) x  four,   using left internal mammary artery, left radial artery and right leg greater saphenous vein harvested endoscopically (N/A) INTRAOPERATIVE TRANSESOPHAGEAL ECHOCARDIOGRAM (N/A) RADIAL ARTERY HARVEST (Left)  Patient Location: SICU  Anesthesia Type:General  Level of Consciousness: sedated and Patient remains intubated per anesthesia plan  Airway & Oxygen Therapy: Patient remains intubated per anesthesia plan and Patient placed on Ventilator (see vital sign flow sheet for setting)  Post-op Assessment: Report given to PACU RN and Post -op Vital signs reviewed and stable  Post vital signs: Reviewed and stable  Complications: No apparent anesthesia complications

## 2014-08-30 NOTE — Progress Notes (Signed)
RT note-Recruitment maneuver  Performed upon arrival.

## 2014-08-30 NOTE — Anesthesia Procedure Notes (Signed)
Procedure Name: Intubation Performed by: Orvilla FusATO, Derreon Consalvo A Pre-anesthesia Checklist: Patient identified, Timeout performed, Emergency Drugs available, Suction available and Patient being monitored Patient Re-evaluated:Patient Re-evaluated prior to inductionOxygen Delivery Method: Circle system utilized Preoxygenation: Pre-oxygenation with 100% oxygen Intubation Type: IV induction Ventilation: Mask ventilation without difficulty and Oral airway inserted - appropriate to patient size Grade View: Grade I Tube type: Oral Tube size: 8.0 mm Number of attempts: 3 (DL grade II MAC 4, Grade II MAC 4 bougie unable to pass, Grade I glidescope ) Airway Equipment and Method: Video-laryngoscopy and Rigid stylet Placement Confirmation: ETT inserted through vocal cords under direct vision,  breath sounds checked- equal and bilateral and positive ETCO2 Secured at: 23 cm Tube secured with: Tape Dental Injury: Teeth and Oropharynx as per pre-operative assessment  Difficulty Due To: Difficult Airway- due to large tongue and Difficult Airway- due to anterior larynx Comments: Glidescope revealed small glottic opening which led to difficulty to pass 8.0 ETT with grade II view. Easy mask.

## 2014-08-30 NOTE — Anesthesia Postprocedure Evaluation (Signed)
  Anesthesia Post-op Note  Patient: Earl Riddle  Procedure(s) Performed: Procedure(s): CORONARY ARTERY BYPASS GRAFTING (CABG) x  four,   using left internal mammary artery, left radial artery and right leg greater saphenous vein harvested endoscopically (N/A) INTRAOPERATIVE TRANSESOPHAGEAL ECHOCARDIOGRAM (N/A) RADIAL ARTERY HARVEST (Left)  Patient Location: ICU  Anesthesia Type:General  Level of Consciousness: sedated  Airway and Oxygen Therapy: Patient remains intubated per anesthesia plan  Post-op Pain: none  Post-op Assessment: Post-op Vital signs reviewed, Patient's Cardiovascular Status Stable and Respiratory Function Stable  Post-op Vital Signs: Reviewed and stable  Last Vitals:  Filed Vitals:   08/30/14 0518  BP: 127/92  Pulse: 92  Temp: 36.4 C  Resp: 18    Complications: No apparent anesthesia complications

## 2014-08-30 NOTE — Progress Notes (Signed)
CT surgery p.m. Rounds   Status  post CABG 4 Hemodynamics stable Minimal chest tube drainage Will start vent wean-patient starting to wake up and is appropriate O2 sats are adequate

## 2014-08-30 NOTE — Progress Notes (Signed)
  Echocardiogram Echocardiogram Transesophageal has been performed.  Ahlana Slaydon FRANCES 08/30/2014, 8:39 AM

## 2014-08-30 NOTE — Progress Notes (Signed)
Pt scheduled for CABG today, family member notified staff that pt has a hx of glaucoma and hasn't taken his eye drops in the last month. MD on call for [facility]Van Tright paged and made aware. MD on call gave verbal order to call anesthesiology and said that [facility]. Anesthesiology said if pt is not having any issues with his eyes at this time he should be fine for surgery. No new orders given, will continue to prep pt for surgery and continue to monitor.   Safa Derner M

## 2014-08-31 ENCOUNTER — Encounter (HOSPITAL_COMMUNITY): Payer: Self-pay | Admitting: Cardiothoracic Surgery

## 2014-08-31 ENCOUNTER — Inpatient Hospital Stay (HOSPITAL_COMMUNITY): Payer: 59

## 2014-08-31 LAB — GLUCOSE, CAPILLARY
Glucose-Capillary: 109 mg/dL — ABNORMAL HIGH (ref 70–99)
Glucose-Capillary: 110 mg/dL — ABNORMAL HIGH (ref 70–99)
Glucose-Capillary: 110 mg/dL — ABNORMAL HIGH (ref 70–99)
Glucose-Capillary: 110 mg/dL — ABNORMAL HIGH (ref 70–99)
Glucose-Capillary: 117 mg/dL — ABNORMAL HIGH (ref 70–99)
Glucose-Capillary: 118 mg/dL — ABNORMAL HIGH (ref 70–99)
Glucose-Capillary: 118 mg/dL — ABNORMAL HIGH (ref 70–99)
Glucose-Capillary: 122 mg/dL — ABNORMAL HIGH (ref 70–99)
Glucose-Capillary: 124 mg/dL — ABNORMAL HIGH (ref 70–99)
Glucose-Capillary: 134 mg/dL — ABNORMAL HIGH (ref 70–99)
Glucose-Capillary: 140 mg/dL — ABNORMAL HIGH (ref 70–99)
Glucose-Capillary: 173 mg/dL — ABNORMAL HIGH (ref 70–99)

## 2014-08-31 LAB — MAGNESIUM
Magnesium: 2.4 mg/dL (ref 1.5–2.5)
Magnesium: 2.3 mg/dL (ref 1.5–2.5)

## 2014-08-31 LAB — BLOOD GAS, ARTERIAL
Acid-base deficit: 0.7 mmol/L (ref 0.0–2.0)
Bicarbonate: 24.8 mEq/L — ABNORMAL HIGH (ref 20.0–24.0)
Drawn by: 252031
O2 Content: 4 L/min
O2 Saturation: 95 %
Patient temperature: 98.6
TCO2: 26.3 mmol/L (ref 0–100)
pCO2 arterial: 50.8 mmHg — ABNORMAL HIGH (ref 35.0–45.0)
pH, Arterial: 7.309 — ABNORMAL LOW (ref 7.350–7.450)
pO2, Arterial: 82.9 mmHg (ref 80.0–100.0)

## 2014-08-31 LAB — CBC
HCT: 32.1 % — ABNORMAL LOW (ref 39.0–52.0)
HCT: 32.7 % — ABNORMAL LOW (ref 39.0–52.0)
HEMOGLOBIN: 10.7 g/dL — AB (ref 13.0–17.0)
Hemoglobin: 10.8 g/dL — ABNORMAL LOW (ref 13.0–17.0)
MCH: 28.9 pg (ref 26.0–34.0)
MCH: 30 pg (ref 26.0–34.0)
MCHC: 33.3 g/dL (ref 30.0–36.0)
MCHC: 33 g/dL (ref 30.0–36.0)
MCV: 86.8 fL (ref 78.0–100.0)
MCV: 90.8 fL (ref 78.0–100.0)
Platelets: 204 10*3/uL (ref 150–400)
Platelets: 188 10*3/uL (ref 150–400)
RBC: 3.7 MIL/uL — AB (ref 4.22–5.81)
RBC: 3.6 MIL/uL — ABNORMAL LOW (ref 4.22–5.81)
RDW: 13.4 % (ref 11.5–15.5)
RDW: 13.8 % (ref 11.5–15.5)
WBC: 11.8 10*3/uL — ABNORMAL HIGH (ref 4.0–10.5)
WBC: 12 10*3/uL — ABNORMAL HIGH (ref 4.0–10.5)

## 2014-08-31 LAB — BASIC METABOLIC PANEL
Anion gap: 5 (ref 5–15)
BUN: 11 mg/dL (ref 6–23)
CO2: 26 mmol/L (ref 19–32)
Calcium: 7.3 mg/dL — ABNORMAL LOW (ref 8.4–10.5)
Chloride: 108 mEq/L (ref 96–112)
Creatinine, Ser: 0.91 mg/dL (ref 0.50–1.35)
GFR calc Af Amer: >90 mL/min (ref >90)
GFR calc non Af Amer: >90 mL/min (ref >90)
Glucose, Bld: 116 mg/dL — ABNORMAL HIGH (ref 70–99)
Potassium: 4.1 mmol/L (ref 3.5–5.1)
SODIUM: 139 mmol/L (ref 135–145)

## 2014-08-31 LAB — CREATININE, SERUM
Creatinine, Ser: 1.2 mg/dL (ref 0.50–1.35)
GFR calc Af Amer: 80 mL/min — ABNORMAL LOW (ref >90)
GFR calc non Af Amer: 69 mL/min — ABNORMAL LOW (ref >90)

## 2014-08-31 MED ORDER — INSULIN DETEMIR 100 UNIT/ML ~~LOC~~ SOLN
10.0000 [IU] | Freq: Two times a day (BID) | SUBCUTANEOUS | Status: DC
  Administered 2014-08-31: 10 [IU] via SUBCUTANEOUS
  Filled 2014-08-31 (×4): qty 0.1

## 2014-08-31 MED ORDER — KETOROLAC TROMETHAMINE 30 MG/ML IJ SOLN
30.0000 mg | Freq: Four times a day (QID) | INTRAMUSCULAR | Status: AC
  Administered 2014-08-31 (×3): 30 mg via INTRAVENOUS
  Filled 2014-08-31 (×2): qty 1

## 2014-08-31 MED ORDER — POTASSIUM CHLORIDE 10 MEQ/50ML IV SOLN: 10.0000 meq | INTRAVENOUS | Status: DC

## 2014-08-31 MED ORDER — POTASSIUM CHLORIDE 10 MEQ/50ML IV SOLN
10.0000 meq | INTRAVENOUS | Status: AC
  Administered 2014-08-31 (×2): 10 meq via INTRAVENOUS
  Filled 2014-08-31: qty 50

## 2014-08-31 MED ORDER — FUROSEMIDE 10 MG/ML IJ SOLN: 20.0000 mg | Freq: Two times a day (BID) | INTRAMUSCULAR | Status: DC

## 2014-08-31 MED ORDER — FUROSEMIDE 10 MG/ML IJ SOLN
40.0000 mg | Freq: Two times a day (BID) | INTRAMUSCULAR | Status: DC
  Administered 2014-08-31 (×2): 40 mg via INTRAVENOUS
  Filled 2014-08-31 (×2): qty 4

## 2014-08-31 MED ORDER — INSULIN ASPART 100 UNIT/ML ~~LOC~~ SOLN
0.0000 [IU] | SUBCUTANEOUS | Status: DC
  Administered 2014-08-31 (×2): 4 [IU] via SUBCUTANEOUS
  Administered 2014-08-31: 3.6 [IU] via SUBCUTANEOUS
  Administered 2014-08-31 – 2014-09-01 (×2): 2 [IU] via SUBCUTANEOUS

## 2014-08-31 MED ORDER — KETOROLAC TROMETHAMINE 30 MG/ML IJ SOLN
30.0000 mg | Freq: Four times a day (QID) | INTRAMUSCULAR | Status: DC
  Filled 2014-08-31: qty 1

## 2014-08-31 MED ORDER — CETYLPYRIDINIUM CHLORIDE 0.05 % MT LIQD: 7.0000 mL | Freq: Two times a day (BID) | OROMUCOSAL | Status: DC

## 2014-08-31 MED ORDER — INSULIN DETEMIR 100 UNIT/ML ~~LOC~~ SOLN: 10.0000 [IU] | Freq: Every day | SUBCUTANEOUS | Status: DC

## 2014-08-31 MED ORDER — ISOSORBIDE MONONITRATE 15 MG HALF TABLET
15.0000 mg | ORAL_TABLET | Freq: Every day | ORAL | Status: DC
  Administered 2014-08-31 – 2014-09-04 (×5): 15 mg via ORAL
  Filled 2014-08-31 (×6): qty 1

## 2014-08-31 MED ORDER — POTASSIUM CHLORIDE CRYS ER 20 MEQ PO TBCR
20.0000 meq | EXTENDED_RELEASE_TABLET | Freq: Once | ORAL | Status: AC
  Administered 2014-08-31: 20 meq via ORAL
  Filled 2014-08-31: qty 1

## 2014-08-31 MED ORDER — INSULIN DETEMIR 100 UNIT/ML ~~LOC~~ SOLN
10.0000 [IU] | Freq: Once | SUBCUTANEOUS | Status: AC
  Administered 2014-08-31: 10 [IU] via SUBCUTANEOUS
  Filled 2014-08-31: qty 0.1

## 2014-08-31 MED ORDER — POTASSIUM CHLORIDE CRYS ER 20 MEQ PO TBCR: 20.0000 meq | EXTENDED_RELEASE_TABLET | Freq: Once | ORAL | Status: DC

## 2014-08-31 MED ORDER — PNEUMOCOCCAL VAC POLYVALENT 25 MCG/0.5ML IJ INJ
0.5000 mL | INJECTION | INTRAMUSCULAR | Status: AC
  Administered 2014-09-03: 0.5 mL via INTRAMUSCULAR
  Filled 2014-08-31 (×2): qty 0.5

## 2014-08-31 MED FILL — Magnesium Sulfate Inj 50%: INTRAMUSCULAR | Qty: 10 | Status: AC

## 2014-08-31 MED FILL — Heparin Sodium (Porcine) Inj 1000 Unit/ML: INTRAMUSCULAR | Qty: 30 | Status: AC

## 2014-08-31 MED FILL — Dexmedetomidine HCl in NaCl 0.9% IV Soln 400 MCG/100ML: INTRAVENOUS | Qty: 100 | Status: AC

## 2014-08-31 MED FILL — Potassium Chloride Inj 2 mEq/ML: INTRAVENOUS | Qty: 40 | Status: AC

## 2014-08-31 MED FILL — Cefuroxime Sodium For Inj 750 MG: INTRAMUSCULAR | Qty: 750 | Status: AC

## 2014-08-31 NOTE — Progress Notes (Signed)
EKG CRITICAL VALUE     12 lead EKG performed.  Critical value noted.  Gilmer MorNicole Zafezn , RN notified.   Ravon Mcilhenny, CCT 08/31/2014 8:06 AM

## 2014-08-31 NOTE — Progress Notes (Signed)
Patient ID: Earl Riddle, male   DOB: August 16, 1965, 49 y.o.   MRN: [id number]  SICU Evening Rounds:  Hemodynamically stable in sinus rhythm  Good urine output.  Ambulated today.  Pain under control after Toradol.  BMET    Component Value Date/Time   NA 139 08/31/2014 0400   K 4.1 08/31/2014 0400   CL 108 08/31/2014 0400   CO2 26 08/31/2014 0400   GLUCOSE 116* 08/31/2014 0400   BUN 11 08/31/2014 0400   CREATININE 1.20 08/31/2014 1658   CALCIUM 7.3* 08/31/2014 0400   GFRNONAA 69* 08/31/2014 1658   GFRAA 80* 08/31/2014 1658    CBC    Component Value Date/Time   WBC 12.0* 08/31/2014 1658   RBC 3.60* 08/31/2014 1658   HGB 10.8* 08/31/2014 1658   HCT 32.7* 08/31/2014 1658   PLT 188 08/31/2014 1658   MCV 90.8 08/31/2014 1658   MCH 30.0 08/31/2014 1658   MCHC 33.0 08/31/2014 1658   RDW 13.8 08/31/2014 1658

## 2014-08-31 NOTE — Progress Notes (Signed)
UR Completed.  [phone]  

## 2014-08-31 NOTE — Op Note (Signed)
Earl Riddle:  Earl Riddle, Earl Riddle             ACCOUNT NO.:  [id number]  MEDICAL RECORD NO.:  [id number]  LOCATION:  2S11C                        FACILITY:  [facility]  PHYSICIAN:  Kerin PernaPeter Van Trigt, M.D.  DATE OF BIRTH:  11-27-1965  DATE OF PROCEDURE: DATE OF DISCHARGE:                              OPERATIVE REPORT   OPERATIONS: 1. Coronary artery bypass grafting x4 (left internal mammary artery to     left anterior descending artery, left radial artery graft to     posterior descending, sequential vein graft to the OM1 and OM2). 2. Endoscopic harvest of right leg greater saphenous vein.  PREOPERATIVE DIAGNOSIS:  Non-ST elevation myocardial infarction with severe three-vessel coronary artery disease.  POSTOPERATIVE DIAGNOSES:  Non-ST elevation myocardial infarction with severe three-vessel coronary artery disease.  SURGEON:  Kerin PernaPeter Van Trigt, M.D.  ASSISTANT:  Rowe ClackWayne E. Gold, P.A.-C.  ANESTHESIA:  General by Dr. Achille RichAdam Hodierne.  INDICATIONS:  The patient is a 49 year old obese, poorly controlled, recently diagnosed diabetic, who presents with unstable angina and positive cardiac enzymes.  Cardiac catheterization at [facility] by Dr. Arnoldo HookerBruce Kowalski  demonstrated severe multivessel CAD including total occlusion of the right coronary and 95% proximal LAD stenosis.  LV function was fairly well preserved.  The patient's symptoms were controlled with heparin and nitroglycerin, and he was transferred to this hospital for surgical coronary revascularization.  Prior to surgery, I reviewed results of the cardiac catheterization with the patient and his family.  I discussed indications and expected benefits of coronary artery bypass surgery for treatment of his severe coronary artery disease.  I reviewed the alternatives to surgical therapy as well.  I  discussed with the patient the main aspects of the operation including the location of the surgical incisions, the use of general anesthesia, and  cardiopulmonary bypass, and the expected postoperative hospital recovery.  I reviewed with the patient the risks to him of coronary bypass surgery including risks of MI, stroke, bleeding requiring blood transfusion, postoperative pulmonary problems including pleural effusion, postoperative arrhythmias, and the potential for permanent pacemaker,  wound infection, and death.  After reviewing these issues, he  demonstrated his understanding and agreed to proceed with the surgery under what I felt was an informed consent.  The patient understood he was at some increased risk because of his morbid obesity with BMI greater than 40.  FINDINGS: 1. Adequate conduit-left radial, left IMA, endoscopically harvest     right leg greater saphenous vein. 2. Adequate heart rate. 3. Good global LV function. 4. No blood products required for this surgery.  OPERATIVE PROCEDURE:  The patient was brought to the operating room and placed supine on the operating room table.  General anesthesia was induced under invasive hemodynamic monitoring.  The chest, abdomen, and legs were prepped with Betadine and draped as a sterile field.  A proper time-out was  performed.  The left arm was also prepped and draped.  A left arm incision was made.  The left radial artery was exposed.  It was clamped after 2000 units of heparin were administered and there was a strong pulse in the palmar arch indicating good collateralization from the ulnar.  A Harmonic Scalpel used to harvest the  radial artery.  The graft was divided proximally and distally and tied and placed in the heparin-papaverine solution for use as a bypass graft.  The incision was closed with a running Vicryl and skin staples.  The left arm was then tucked at the side maintaining a sterile field.  Next, a sternal incision was made as the saphenous vein was harvested endoscopically from the right leg.  The left internal mammary artery was harvested as a  pedicle graft from its origin at the subclavian vessels. It is an 1.5-mm vessel with excellent flow.  The sternal retractor was placed using the deep blades due to the patient's obese body habitus.  The pericardium was opened and suspended. Pursestrings were placed in the ascending aorta and right atrium and heparin was administered and ACT was documented as being therapeutic. The patient was cannulated and placed on cardiopulmonary bypass.  The coronary arteries were identified for grafting and the mammary artery and vein grafts were prepared for the distal anastomoses as was the left radial artery.  Cardioplegia cannulas were placed both antegrade and retrograde cold blood cardioplegia, and the patient was cooled to 32 degrees.  The aortic crossclamp was applied and one liter of cold blood cardioplegia was delivered in split doses between the antegrade aortic and coronary sinus retrograde catheter.  There was good cardioplegic arrest and septal temperature dropped less than 12 degrees. Cardioplegia was delivered every 20 minutes or less.  The distal coronary anastomosis and coronary anastomosis were then performed.  The first distal anastomosis was to the posterior descending using the left radial artery free graft.  The posterior descending was 1.5 mm with total proximal occlusion.  The left radial was sewn end-to- side with running 8-0 Prolene with excellent flow through the graft. Cardioplegia was redosed.  The second and third distal anastomoses consisted of a sequential vein graft to OM1 and OM2.  The OM1 was 1.5-mm vessel with proximal 75% stenosis.  The vein was sewn side-to-side with running 7-0 Prolene with a  good flow through graft.  The third distal anastomosis was the continuation of the sequential vein graft to the OM2.  This was an 1.6 mm vessel with proximal 75% stenosis.  The ends of the sequential vein were then sewn end-to-side with running 7-0 Prolene with  excellent flow through graft. Cardioplegia was redosed.  The fourth distal anastomosis was the left IMA to the LAD.  The left IMA pedicle was brought through an opening, and the left lateral pericardium was brought down onto the LAD and sewn end-to-side with running 8-0 Prolene.  There was excellent flow through the anastomosis after briefly releasing the pedicle bulldog on the mammary artery.  The bulldog was reapplied to the mammary pedicle and the pedicle secured at the epicardium with 6-0 Prolene.  Cardioplegia was redosed.  While the crossclamp was still in place, the 2 proximal anastomoses were performed on the ascending aorta-both the proximal vein anastomosis and the proximal radial artery anastomosis.  The vein anastomosis of the sequential graft was sewn end-to-side with a running 6-0 Prolene using a 4.0 mm punch.  The proximal radial artery anastomosis was constructed using a 4.0 mm punch and running 7-0 Prolene.  Air was vented from the coronaries with a dose of retrograde cardioplegia and the crossclamp was removed.  The vein grafts were de-aired and opened and each had good flow and hemostasis was documented at the proximal and distal anastomoses.  The patient was rewarmed and reperfused.  Temporary pacing wires  were applied.  When the patient reached 37 degrees, he was weaned off cardiopulmonary bypass without incident.  Hemodynamics were stable. Protamine was administered without adverse reaction.  The superior pericardial fat was closed over the aorta and vein grafts.  Anterior mediastinal and left pleural chest tube were placed and brought through separate incisions.  The sternum was closed with interrupted steel wire. The pectoralis fascia was closed in running #1 Vicryl.  The subcutaneous and skin layers were closed using running Vicryl.     Kerin Perna, M.D.     PV/MEDQ  D:  08/30/2014  T:  08/31/2014  Job:  [id number]  cc:   Arnoldo Hooker, MD

## 2014-08-31 NOTE — Progress Notes (Addendum)
[facility] DAILY ICU PROGRESS NOTE                   [address]            [place] [id number]          [phone]   1 Day Post-Op Procedure(s) (LRB): CORONARY ARTERY BYPASS GRAFTING (CABG) x  four,   using left internal mammary artery, left radial artery and right leg greater saphenous vein harvested endoscopically (N/A) INTRAOPERATIVE TRANSESOPHAGEAL ECHOCARDIOGRAM (N/A) RADIAL ARTERY HARVEST (Left)  Total Length of Stay:  LOS: 4 days   Subjective: Patient with a lot of incisional pain.  Objective: Vital signs in last 24 hours: Temp:  [97.3 F (36.3 C)-100 F (37.8 C)] 99.3 F (37.4 C) (01/20 0700) Pulse Rate:  [84-102] 93 (01/20 0700) Cardiac Rhythm:  [-] Normal sinus rhythm (01/20 0600) Resp:  [12-42] 36 (01/20 0700) BP: (80-128)/(53-90) 115/71 mmHg (01/20 0700) SpO2:  [93 %-100 %] 94 % (01/20 0700) Arterial Line BP: (85-164)/(55-92) 129/64 mmHg (01/20 0700) FiO2 (%):  [40 %-80 %] 40 % (01/19 2124) Weight:  [299 lb 12.8 oz (135.988 kg)] 299 lb 12.8 oz (135.988 kg) (01/20 0500)  Filed Weights   08/29/14 0608 08/30/14 0522 08/31/14 0500  Weight: 284 lb 9.8 oz (129.1 kg) 284 lb 11.2 oz (129.139 kg) 299 lb 12.8 oz (135.988 kg)    Weight change: 15 lb 1.6 oz (6.849 kg)   Hemodynamic parameters for last 24 hours: PAP: (21-60)/(13-33) 51/21 mmHg CO:  [4 L/min-7.7 L/min] 7.7 L/min CI:  [1.6 L/min/m2-3.1 L/min/m2] 3.1 L/min/m2  Intake/Output from previous day: 01/19 0701 - 01/20 0700 In: 4914.5 [I.V.:3200.5; Blood:634; NG/GT:30; IV Piggyback:1050] Out: 2795 [Urine:2175; Emesis/NG output:100; Chest Tube:520]  Intake/Output this shift: Total I/O In: 4 [I.V.:4] Out: 60 [Urine:60]  Current Meds: Scheduled Meds: . acetaminophen  1,000 mg Oral 4 times per day   Or  . acetaminophen (TYLENOL) oral liquid 160 mg/5 mL  1,000 mg Per Tube 4 times per day  . [START ON 09/01/2014] antiseptic oral rinse  7 mL Mouth Rinse BID  . aspirin EC  325 mg Oral Daily   Or    . aspirin  324 mg Per Tube Daily  . bisacodyl  10 mg Oral Daily   Or  . bisacodyl  10 mg Rectal Daily  . cefUROXime (ZINACEF)  IV  1.5 g Intravenous Q12H  . chlorhexidine  15 mL Mouth Rinse BID  . Chlorhexidine Gluconate Cloth  6 each Topical Daily  . docusate sodium  200 mg Oral Daily  . famotidine (PEPCID) IV  20 mg Intravenous Q12H  . insulin regular  0-10 Units Intravenous TID WC  . metoprolol tartrate  12.5 mg Oral BID   Or  . metoprolol tartrate  12.5 mg Per Tube BID  . mupirocin ointment  1 application Nasal BID  . [START ON 09/01/2014] pantoprazole  40 mg Oral Daily  . [START ON 09/01/2014] pneumococcal 23 valent vaccine  0.5 mL Intramuscular Tomorrow-1000  . sodium chloride  3 mL Intravenous Q12H   Continuous Infusions: . sodium chloride 20 mL/hr at 08/31/14 0000  . sodium chloride    . sodium chloride 20 mL/hr at 08/30/14 2052  . dexmedetomidine Stopped (08/30/14 1800)  . DOPamine Stopped (08/30/14 1510)  . insulin (NOVOLIN-R) infusion 4 Units/hr (08/31/14 0800)  . lactated ringers 20 mL/hr at 08/31/14 0000  . nitroGLYCERIN 5 mcg/min (08/31/14 0400)  . phenylephrine (NEO-SYNEPHRINE) Adult infusion 10 mcg/min (08/31/14 0700)  PRN Meds:.albumin human, metoprolol, midazolam, morphine injection, ondansetron (ZOFRAN) IV, oxyCODONE, sodium chloride, traMADol  General appearance: alert, cooperative and no distress  Heart: RRR, rub with chest tubes in place Lungs: Diminished at bases Abdomen: soft, non-tender; bowel sounds normal; no masses,  no organomegaly Extremities: Bilateral lower extremity edema Wound: Aqauacel intact;compression dressing over left radial artery harvest Neurologic: intact  Lab Results: CBC: Recent Labs  08/30/14 2145 08/30/14 2150 08/31/14 0400  WBC 13.8*  --  11.8*  HGB 11.2* 11.9* 10.7*  HCT 32.5* 35.0* 32.1*  PLT 203  --  204   BMET:  Recent Labs  08/29/14 1445  08/30/14 2150 08/31/14 0400  NA 137  < > 141 139  K 4.1  < > 4.3  4.1  CL 101  < > 103 108  CO2 27  --   --  26  GLUCOSE 171*  < > 168* 116*  BUN 15  < > 12 11  CREATININE 1.24  < > 0.80 0.91  CALCIUM 8.9  --   --  7.3*  < > = values in this interval not displayed.  PT/INR:  Recent Labs  08/30/14 1500  LABPROT 15.6*  INR 1.23   Radiology: Dg Chest Port 1 View  08/31/2014   CLINICAL DATA:  Coronary artery bypass  EXAM: PORTABLE CHEST - 1 VIEW  COMPARISON:  Yesterday  FINDINGS: Endotracheal and NG tubes removed. Stable chest tubes. Central airspace disease in the left lung is worse. Right basilar atelectasis. Overall lung volumes are less aerated. Normal vascularity.  IMPRESSION: Extubated.  Lower lung volumes.  Increasing left lung central airspace disease.  No pneumothorax.   Electronically Signed   By: Maryclare Bean M.D.   On: 08/31/2014 08:08   Dg Chest Port 1 View  08/30/2014   CLINICAL DATA:  Status post coronary bypass graft  EXAM: PORTABLE CHEST - 1 VIEW  COMPARISON:  08/28/2014  FINDINGS: The endotracheal tube is noted 4.6 cm above the carina. Nasogastric catheter extends to the stomach. A Swan-Ganz catheter is noted in the pulmonary outflow tract. A left thoracostomy tube is noted. No pneumothorax is seen. Mild right basilar atelectasis is noted as well as some left lower lobe consolidation with air bronchograms. No acute bony abnormality is seen.  IMPRESSION: Bibasilar changes left greater than right.  Tubes and lines as described.   Electronically Signed   By: Alcide Clever M.D.   On: 08/30/2014 15:42   Assessment/Plan: S/P Procedure(s) (LRB): CORONARY ARTERY BYPASS GRAFTING (CABG) x  four,   using left internal mammary artery, left radial artery and right leg greater saphenous vein harvested endoscopically (N/A) INTRAOPERATIVE TRANSESOPHAGEAL ECHOCARDIOGRAM (N/A) RADIAL ARTERY HARVEST (Left)  1.CV-EKG reviewed. SR in 90's. On Nitro, Neo synephrine drips. Also, on Lopressor 12.5 bid. Stop Nitro drip and wean Neo as tolerates. Start  Imdur. 2.Pulmonary-Chest tube with 520 cc of output last 24 hours. CXR this am shows no pneumothorax, low lung volumes. Leave chest tubes for now. Will remove pleural ct but leave mediastinal. On 4 liters of oxygen via . Will wean as tolerates over next few days. Encourage incentive spirometer and flutter valve. 3. Volume Overload-Lasix 20 IV bid today 4. ABL anemia- H and H 10.7 and 32.1 5. Incisional pain-has required a fair amount of Morphine for pain.Toradol for 3 doses to help with incisional pain 6.DM-CBGs 145/140/134. Start daily Levemir.Pre op HGA1C 8.4. Will restart Metformin in 1-2 days when tolerating oral better 7. May remove ACE on left forearm 8.  Please see progression orders   Elenore Rota 08/31/2014 8:38 AM  Patient was good after multivessel CABGWith radial artery graft Left hand is warm and functional Will DC lines mobilize and start diuresis Poorly controlled diabetic-start Levemir  and sliding scale patient examined and medical record reviewed,agree with above note. VAN TRIGT III,PETER 08/31/2014

## 2014-09-01 ENCOUNTER — Inpatient Hospital Stay (HOSPITAL_COMMUNITY): Payer: 59

## 2014-09-01 LAB — CBC
HEMATOCRIT: 31.9 % — AB (ref 39.0–52.0)
Hemoglobin: 10.8 g/dL — ABNORMAL LOW (ref 13.0–17.0)
MCH: 29.8 pg (ref 26.0–34.0)
MCHC: 33.9 g/dL (ref 30.0–36.0)
MCV: 88.1 fL (ref 78.0–100.0)
PLATELETS: 142 10*3/uL — AB (ref 150–400)
RBC: 3.62 MIL/uL — ABNORMAL LOW (ref 4.22–5.81)
RDW: 13.7 % (ref 11.5–15.5)
WBC: 8.4 10*3/uL (ref 4.0–10.5)

## 2014-09-01 LAB — BASIC METABOLIC PANEL
ANION GAP: 7 (ref 5–15)
BUN: 15 mg/dL (ref 6–23)
CHLORIDE: 102 meq/L (ref 96–112)
CO2: 26 mmol/L (ref 19–32)
Calcium: 7.5 mg/dL — ABNORMAL LOW (ref 8.4–10.5)
Creatinine, Ser: 1.05 mg/dL (ref 0.50–1.35)
GFR calc non Af Amer: 81 mL/min — ABNORMAL LOW (ref >90)
Glucose, Bld: 162 mg/dL — ABNORMAL HIGH (ref 70–99)
Potassium: 4.2 mmol/L (ref 3.5–5.1)
Sodium: 135 mmol/L (ref 135–145)

## 2014-09-01 LAB — GLUCOSE, CAPILLARY
Glucose-Capillary: 88 mg/dL (ref 70–99)
Glucose-Capillary: 105 mg/dL — ABNORMAL HIGH (ref 70–99)
Glucose-Capillary: 98 mg/dL (ref 70–99)
Glucose-Capillary: 108 mg/dL — ABNORMAL HIGH (ref 70–99)
Glucose-Capillary: 118 mg/dL — ABNORMAL HIGH (ref 70–99)
Glucose-Capillary: 120 mg/dL — ABNORMAL HIGH (ref 70–99)
Glucose-Capillary: 139 mg/dL — ABNORMAL HIGH (ref 70–99)
Glucose-Capillary: 193 mg/dL — ABNORMAL HIGH (ref 70–99)
Glucose-Capillary: 67 mg/dL — ABNORMAL LOW (ref 70–99)
Glucose-Capillary: 113 mg/dL — ABNORMAL HIGH (ref 70–99)
Glucose-Capillary: 133 mg/dL — ABNORMAL HIGH (ref 70–99)
Glucose-Capillary: 139 mg/dL — ABNORMAL HIGH (ref 70–99)
Glucose-Capillary: 156 mg/dL — ABNORMAL HIGH (ref 70–99)
Glucose-Capillary: 121 mg/dL — ABNORMAL HIGH (ref 70–99)

## 2014-09-01 MED ORDER — FUROSEMIDE 10 MG/ML IJ SOLN: 40.0000 mg | Freq: Once | INTRAMUSCULAR | Status: DC

## 2014-09-01 MED ORDER — INSULIN ASPART 100 UNIT/ML ~~LOC~~ SOLN
0.0000 [IU] | Freq: Three times a day (TID) | SUBCUTANEOUS | Status: DC
  Administered 2014-09-01 – 2014-09-04 (×5): 2 [IU] via SUBCUTANEOUS

## 2014-09-01 MED ORDER — FUROSEMIDE 40 MG PO TABS
40.0000 mg | ORAL_TABLET | Freq: Every day | ORAL | Status: DC
  Filled 2014-09-01: qty 1

## 2014-09-01 MED ORDER — KETOROLAC TROMETHAMINE 30 MG/ML IJ SOLN
30.0000 mg | Freq: Once | INTRAMUSCULAR | Status: AC
  Administered 2014-09-01: 30 mg via INTRAVENOUS
  Filled 2014-09-01: qty 1

## 2014-09-01 MED ORDER — SODIUM CHLORIDE 0.9 % IV SOLN: 250.0000 mL | INTRAVENOUS | Status: DC | PRN

## 2014-09-01 MED ORDER — LIVING WELL WITH DIABETES BOOK
Freq: Once | Status: AC
  Administered 2014-09-01: 18:00:00
  Filled 2014-09-01: qty 1

## 2014-09-01 MED ORDER — POTASSIUM CHLORIDE CRYS ER 20 MEQ PO TBCR
20.0000 meq | EXTENDED_RELEASE_TABLET | Freq: Once | ORAL | Status: AC
  Administered 2014-09-01: 20 meq via ORAL
  Filled 2014-09-01: qty 1

## 2014-09-01 MED ORDER — SODIUM CHLORIDE 0.9 % IJ SOLN: 3.0000 mL | INTRAMUSCULAR | Status: DC | PRN

## 2014-09-01 MED ORDER — MOVING RIGHT ALONG BOOK
Freq: Once | Status: AC
  Administered 2014-09-01: 18:00:00
  Filled 2014-09-01: qty 1

## 2014-09-01 MED ORDER — METFORMIN HCL 500 MG PO TABS
500.0000 mg | ORAL_TABLET | Freq: Two times a day (BID) | ORAL | Status: DC
  Administered 2014-09-01 – 2014-09-04 (×7): 500 mg via ORAL
  Filled 2014-09-01 (×9): qty 1

## 2014-09-01 MED ORDER — FUROSEMIDE 10 MG/ML IJ SOLN
40.0000 mg | Freq: Two times a day (BID) | INTRAMUSCULAR | Status: DC
  Administered 2014-09-01: 40 mg via INTRAVENOUS
  Filled 2014-09-01: qty 4

## 2014-09-01 MED ORDER — METOPROLOL TARTRATE 25 MG PO TABS
25.0000 mg | ORAL_TABLET | Freq: Two times a day (BID) | ORAL | Status: DC
  Administered 2014-09-01 – 2014-09-04 (×7): 25 mg via ORAL
  Filled 2014-09-01 (×9): qty 1

## 2014-09-01 MED ORDER — MOVING RIGHT ALONG BOOK
Freq: Once | Status: AC
  Filled 2014-09-01: qty 1

## 2014-09-01 MED ORDER — SODIUM CHLORIDE 0.9 % IJ SOLN
3.0000 mL | Freq: Two times a day (BID) | INTRAMUSCULAR | Status: DC
  Administered 2014-09-02 (×2): 3 mL via INTRAVENOUS

## 2014-09-01 MED ORDER — SODIUM CHLORIDE 0.9 % IJ SOLN
3.0000 mL | Freq: Two times a day (BID) | INTRAMUSCULAR | Status: DC
  Administered 2014-09-01 – 2014-09-03 (×4): 3 mL via INTRAVENOUS

## 2014-09-01 MED ORDER — SIMVASTATIN 20 MG PO TABS
20.0000 mg | ORAL_TABLET | Freq: Every day | ORAL | Status: DC
  Administered 2014-09-01 – 2014-09-03 (×3): 20 mg via ORAL
  Filled 2014-09-01 (×4): qty 1

## 2014-09-01 MED ORDER — OMEGA-3-ACID ETHYL ESTERS 1 G PO CAPS
1.0000 g | ORAL_CAPSULE | Freq: Every day | ORAL | Status: DC
  Administered 2014-09-01: 1 g via ORAL
  Filled 2014-09-01 (×2): qty 1

## 2014-09-01 MED ORDER — ZOLPIDEM TARTRATE 5 MG PO TABS: 5.0000 mg | ORAL_TABLET | Freq: Every evening | ORAL | Status: DC | PRN

## 2014-09-01 NOTE — Discharge Instructions (Signed)
Activity: 1.May walk up steps °               2.No lifting more than ten pounds for four weeks.  °               3.No driving for four weeks. °               4.Stop any activity that causes chest pain, shortness of breath, dizziness, sweating or excessive weakness. °               5.Avoid straining. °               6.Continue with your breathing exercises daily. ° °Diet: Diabetic diet and Low fat, Low salt diet ° °Wound Care: May shower.  Clean wounds with mild soap and water daily. Contact the office at [phone] if any problems arise. ° °Coronary Artery Bypass Grafting, Care After °Refer to this sheet in the next few weeks. These instructions provide you with information on caring for yourself after your procedure. Your health care provider may also give you more specific instructions. Your treatment has been planned according to current medical practices, but problems sometimes occur. Call your health care provider if you have any problems or questions after your procedure. °WHAT TO EXPECT AFTER THE PROCEDURE °Recovery from surgery will be different for everyone. Some people feel well after 3 or 4 weeks, while for others it takes longer. After your procedure, it is typical to have the following: °· Nausea and a lack of appetite.   °· Constipation. °· Weakness and fatigue.   °· Depression or irritability.   °· Pain or discomfort at your incision site. °HOME CARE INSTRUCTIONS °· Take medicines only as directed by your health care provider. Do not stop taking medicines or start any new medicines without first checking with your health care provider. °· Take your pulse as directed by your health care provider. °· Perform deep breathing as directed by your health care provider. If you were given a device called an incentive spirometer, use it to practice deep breathing several times a day. Support your chest with a pillow or your arms when you take deep breaths or cough. °· Keep incision areas clean, dry, and  protected. Remove or change any bandages (dressings) only as directed by your health care provider. You may have skin adhesive strips over the incision areas. Do not take the strips off. They will fall off on their own. °· Check incision areas daily for any swelling, redness, or drainage. °· If incisions were made in your legs, do the following: °¨ Avoid crossing your legs.   °¨ Avoid sitting for long periods of time. Change positions every 30 minutes.   °¨ Elevate your legs when you are sitting. °· Wear compression stockings as directed by your health care provider. These stockings help keep blood clots from forming in your legs. °· Take showers once your health care provider approves. Until then, only take sponge baths. Pat incisions dry. Do not rub incisions with a washcloth or towel. Do not take baths, swim, or use a hot tub until your health care provider approves. °· Eat foods that are high in fiber, such as raw fruits and vegetables, whole grains, beans, and nuts. Meats should be lean cut. Avoid canned, processed, and fried foods. °· Drink enough fluid to keep your urine clear or pale yellow. °· Weigh yourself every day. This helps identify if you are retaining fluid that may make your heart and lungs   work harder. °· Rest and limit activity as directed by your health care provider. You may be instructed to: °¨ Stop any activity at once if you have chest pain, shortness of breath, irregular heartbeats, or dizziness. Get help right away if you have any of these symptoms. °¨ Move around frequently for short periods or take short walks as directed by your health care provider. Increase your activities gradually. You may need physical therapy or cardiac rehabilitation to help strengthen your muscles and build your endurance. °¨ Avoid lifting, pushing, or pulling anything heavier than 10 lb (4.5 kg) for at least 6 weeks after surgery. °· Do not drive until your health care provider approves.  °· Ask your health  care provider when you may return to work. °· Ask your health care provider when you may resume sexual activity. °· Keep all follow-up visits as directed by your health care provider. This is important. °SEEK MEDICAL CARE IF: °· You have swelling, redness, increasing pain, or drainage at the site of an incision. °· You have a fever. °· You have swelling in your ankles or legs. °· You have pain in your legs.   °· You gain 2 or more pounds (0.9 kg) a day. °· You are nauseous or vomit. °· You have diarrhea.  °SEEK IMMEDIATE MEDICAL CARE IF: °· You have chest pain that goes to your jaw or arms. °· You have shortness of breath.   °· You have a fast or irregular heartbeat.   °· You notice a "clicking" in your breastbone (sternum) when you move.   °· You have numbness or weakness in your arms or legs. °· You feel dizzy or light-headed.   °MAKE SURE YOU: °· Understand these instructions. °· Will watch your condition. °· Will get help right away if you are not doing well or get worse. °Document Released: 02/15/2005 Document Revised: 12/13/2013 Document Reviewed: 01/05/2013 °Exit[employer]® Patient Information ©2015 [employer]. This information is not intended to replace advice given to you by your health care provider. Make sure you discuss any questions you have with your health care provider. ° ° ° °

## 2014-09-01 NOTE — Progress Notes (Signed)
Pt transferred to 2W22 - pt ambulated w/ assistance of the wheelchair from 2S to 2W.  All VS WNL upon transfer.  Receiving nurse met pt in room.  Report had been called prior to transfer.  Nurse advised that foley had been d/c'd prior to transfer and that the pt had not yet voided.

## 2014-09-01 NOTE — Progress Notes (Signed)
Inpatient Diabetes Program Recommendations  AACE/ADA: New Consensus Statement on Inpatient Glycemic Control (2013)  Target Ranges:  Prepandial:   less than 140 mg/dL      Peak postprandial:   less than 180 mg/dL (1-2 hours)      Critically ill patients:  140 - 180 mg/dL   Reason for Visit:  New onset Diabetes per patient 1 week ago.  A1C=8.4%.  Patient was just started on Metformin last week.  He does not have a meter.  MD please prescribe meter at discharge.  Briefly discussed diabetes diagnosis and goal A1C of 7.0%.  Told patient that he would likely need to monitor daily initially and then maybe 3 times weekly.  He had questions regarding CHO.  Ordered patient Living Well with Diabetes booklet and will also order outpatient diabetes education follow-up.  Will also request dietician consult:  New Onset Diabetes.  He seemed knowledgeable and states that he plans to [facility].  No further questions at this time. Ordered Living well with diabetes book and diabetes videos.  Thanks, Beryl MeagerJenny Damante Spragg, RN, BC-ADM Inpatient Diabetes Coordinator Pager [phone]

## 2014-09-01 NOTE — Progress Notes (Signed)
Inpatient Diabetes Program Recommendations  AACE/ADA: New Consensus Statement on Inpatient Glycemic Control (2013)  Target Ranges:  Prepandial:   less than 140 mg/dL      Peak postprandial:   less than 180 mg/dL (1-2 hours)      Critically ill patients:  140 - 180 mg/dL   Reason for Assessment:  Results for Earl Riddle, Earl Riddle (MRN [id number]) as of 09/01/2014 11:34  Ref. Range 08/31/2014 16:43 08/31/2014 23:38 09/01/2014 04:26 09/01/2014 08:19  Glucose-Capillary Latest Range: 70-99 mg/dL 478139 (H) 295173 (H) 67 (L) 133 (H)  Results for Earl Riddle, Earl Riddle (MRN [id number]) as of 09/01/2014 11:34  Ref. Range 08/29/2014 04:49  Hgb A1c MFr Bld Latest Range: <5.7 % 8.4 (H)   Diabetes history: Type 2 diabetes Outpatient Diabetes medications: Metformin 500 mg bid Current orders for Inpatient glycemic control:  Metformin 500 mg bid  Please consider restarting Novolog moderate correction tid with meals an HS.  Will talk to patient regarding elevated A1C.    Thanks, Beryl MeagerJenny Aidah Forquer, RN, BC-ADM Inpatient Diabetes Coordinator Pager [phone]

## 2014-09-01 NOTE — Progress Notes (Addendum)
[facility] DAILY ICU PROGRESS NOTE                   [address]            [place] [address]          [phone]   2 Days Post-Op Procedure(s) (LRB): CORONARY ARTERY BYPASS GRAFTING (CABG) x  four,   using left internal mammary artery, left radial artery and right leg greater saphenous vein harvested endoscopically (N/A) INTRAOPERATIVE TRANSESOPHAGEAL ECHOCARDIOGRAM (N/A) RADIAL ARTERY HARVEST (Left)  Total Length of Stay:  LOS: 5 days   Subjective: Patient did not sleep much. Toradol helped with incisional pain.  Objective: Vital signs in last 24 hours: Temp:  [97.3 F (36.3 C)-99.5 F (37.5 C)] 98.4 F (36.9 C) (01/21 0436) Pulse Rate:  [89-100] 94 (01/21 0600) Cardiac Rhythm:  [-] Normal sinus rhythm (01/21 0600) Resp:  [14-27] 22 (01/21 0600) BP: (97-127)/(44-78) 106/69 mmHg (01/21 0600) SpO2:  [88 %-100 %] 96 % (01/21 0600) Arterial Line BP: (124-127)/(58-60) 127/60 mmHg (01/20 1000) Weight:  [298 lb 14.4 oz (135.58 kg)] 298 lb 14.4 oz (135.58 kg) (01/21 0600)  Filed Weights   08/30/14 0522 08/31/14 0500 09/01/14 0600  Weight: 284 lb 11.2 oz (129.139 kg) 299 lb 12.8 oz (135.988 kg) 298 lb 14.4 oz (135.58 kg)    Weight change: -14.4 oz (-0.408 kg)   Hemodynamic parameters for last 24 hours: PAP: (39-50)/(20-26) 39/26 mmHg CI:  [2.7 L/min/m2] 2.7 L/min/m2  Intake/Output from previous day: 01/20 0701 - 01/21 0700 In: 1311.7 [P.O.:840; I.V.:271.7; IV Piggyback:200] Out: 2075 [Urine:1815; Chest Tube:260]     Current Meds: Scheduled Meds: . acetaminophen  1,000 mg Oral 4 times per day   Or  . acetaminophen (TYLENOL) oral liquid 160 mg/5 mL  1,000 mg Per Tube 4 times per day  . antiseptic oral rinse  7 mL Mouth Rinse BID  . aspirin EC  325 mg Oral Daily   Or  . aspirin  324 mg Per Tube Daily  . bisacodyl  10 mg Oral Daily   Or  . bisacodyl  10 mg Rectal Daily  . chlorhexidine  15 mL Mouth Rinse BID  . Chlorhexidine Gluconate Cloth  6 each  Topical Daily  . docusate sodium  200 mg Oral Daily  . furosemide  40 mg Intravenous BID  . insulin aspart  0-24 Units Subcutaneous 6 times per day  . insulin detemir  10 Units Subcutaneous BID  . isosorbide mononitrate  15 mg Oral Daily  . metoprolol tartrate  12.5 mg Oral BID   Or  . metoprolol tartrate  12.5 mg Per Tube BID  . mupirocin ointment  1 application Nasal BID  . pantoprazole  40 mg Oral Daily  . pneumococcal 23 valent vaccine  0.5 mL Intramuscular Tomorrow-1000  . potassium chloride  20 mEq Oral Once  . sodium chloride  3 mL Intravenous Q12H   Continuous Infusions: . sodium chloride 20 mL/hr at 08/31/14 0000  . sodium chloride    . sodium chloride 20 mL/hr at 08/30/14 2052  . lactated ringers 20 mL/hr at 08/31/14 0000   PRN Meds:.metoprolol, midazolam, morphine injection, ondansetron (ZOFRAN) IV, oxyCODONE, sodium chloride, traMADol  General appearance: alert, cooperative and no distress Heart: RRR Lungs: Diminished at bases Abdomen: soft, non-tender; bowel sounds normal; no masses,  no organomegaly Extremities: Bilateral lower extremity edema Wound: Aqauacel intact;compression dressing over left radial artery harvest. LLE dressing is clean and dry. Neurologic: intact  Lab Results: CBC:  Recent Labs  08/31/14 1658 09/01/14 0253  WBC 12.0* 8.4  HGB 10.8* 10.8*  HCT 32.7* 31.9*  PLT 188 142*   BMET:   Recent Labs  08/31/14 0400 08/31/14 1658 09/01/14 0253  NA 139  --  135  K 4.1  --  4.2  CL 108  --  102  CO2 26  --  26  GLUCOSE 116*  --  162*  BUN 11  --  15  CREATININE 0.91 1.20 1.05  CALCIUM 7.3*  --  7.5*    PT/INR:   Recent Labs  08/30/14 1500  LABPROT 15.6*  INR 1.23   Radiology: Dg Chest Port 1 View  08/31/2014   CLINICAL DATA:  Coronary artery bypass  EXAM: PORTABLE CHEST - 1 VIEW  COMPARISON:  Yesterday  FINDINGS: Endotracheal and NG tubes removed. Stable chest tubes. Central airspace disease in the left lung is worse. Right  basilar atelectasis. Overall lung volumes are less aerated. Normal vascularity.  IMPRESSION: Extubated.  Lower lung volumes.  Increasing left lung central airspace disease.  No pneumothorax.   Electronically Signed   By: Maryclare BeanArt  Hoss M.D.   On: 08/31/2014 08:08   Dg Chest Port 1 View  08/30/2014   CLINICAL DATA:  Status post coronary bypass graft  EXAM: PORTABLE CHEST - 1 VIEW  COMPARISON:  08/28/2014  FINDINGS: The endotracheal tube is noted 4.6 cm above the carina. Nasogastric catheter extends to the stomach. A Swan-Ganz catheter is noted in the pulmonary outflow tract. A left thoracostomy tube is noted. No pneumothorax is seen. Mild right basilar atelectasis is noted as well as some left lower lobe consolidation with air bronchograms. No acute bony abnormality is seen.  IMPRESSION: Bibasilar changes left greater than right.  Tubes and lines as described.   Electronically Signed   By: Alcide CleverMark  Lukens M.D.   On: 08/30/2014 15:42   Assessment/Plan: S/P Procedure(s) (LRB): CORONARY ARTERY BYPASS GRAFTING (CABG) x  four,   using left internal mammary artery, left radial artery and right leg greater saphenous vein harvested endoscopically (N/A) INTRAOPERATIVE TRANSESOPHAGEAL ECHOCARDIOGRAM (N/A) RADIAL ARTERY HARVEST (Left)  1.CV- SR in 90's. On Lopressor 12.5 bid and Imdur 15 daily. Consider ACE when BP allows. 2.Pulmonary-Chest tube with 260 cc of output last 24 hours. CXR this am shows no pneumothorax, low lung volumes, and worsening aeration on left.  Likely remove  mediastinal. On 2 liters of oxygen via [place]. Will wean as tolerates over next few days. Encourage incentive spirometer and flutter valve. 3. Volume Overload-Give Lasix 40 IV today 4. ABL anemia- H and H 10.8 and 31.9 5.DM-CBGs 110/173/67. Continue Levemir.Pre op HGA1C 8.4. Will restart Metformin in am 6. Not on statin. Will start. Also, had very high triglycerides pre op. Should be on omega 3 as well 7. Remove foley 8. Transfer to [facility]-orders  writtten  ZIMMERMAN,DONIELLE M PA-C 09/01/2014 7:25 AM  tx to stepdown Needs diabetic teaching- hope to send home on oral agents DC MT Cont IV lasix  patient examined and medical record reviewed,agree with above note. VAN TRIGT III,Orabelle Rylee 09/01/2014

## 2014-09-01 NOTE — Progress Notes (Signed)
1410 Pt has walked three times today per RN and walked over. Will let pt rest and follow up tomorrow.Luetta NuttingCharlene Natilie Krabbenhoft RN BSN 09/01/2014 2:09 PM

## 2014-09-01 NOTE — Progress Notes (Signed)
09/01/2014 3:10 PM Nursing note Pt. Given Living Well with Diabetes book as well as Moving Right Along book. Content reviewed. Questions encouraged. Will continue to monitor patient and reinforce information as needed.  Juanita Devincent, Blanchard KelchStephanie Ingold

## 2014-09-01 NOTE — Discharge Summary (Signed)
Physician Discharge Summary       [address]       [place] [id number]             [phone]    Patient ID: Earl Riddle MRN: [id number] DOB/AGE: [birth year] 51 y.o.  Admit date: 08/27/2014 Discharge date: 09/01/2014  Admission Diagnoses: 1. S/p NSTEMI 2. Multivessel CAD 3. Diabetes Mellitus 4. History of morbid obesity 5. Low HDL and high triglycerides  Discharge Diagnoses:  1. S/p NSTEMI 2. Multivessel CAD 3. Diabetes Mellitus 4. History of morbid obesity 5. Low HDL and high triglycerides 6. ABL anemia 7. Mild thrombocytopenia  Procedure (s):  Coronary artery bypass grafting x4 (left internal mammary artery to  left anterior descending artery, left radial artery graft to  posterior descending, sequential vein graft to the OM1 and OM2). 2. Endoscopic harvest of right leg greater saphenous vein by Dr. Donata ClayVan Trigt on 08/30/2014.  History of Presenting Illness: Patient examined, cardiac catheterization and 2-D echocardiogram reviewed by Dr. Donata ClayVan Trigt.   This is a 49 year old Caucasian diabetic male nonsmoker with positive family history for CAD/CABG who was transferred from [facility] following his presentation with chest pain and non-ST elevation MI. Cardiac catheterization demonstrated severe three-vessel CAD with chronic occlusion of the RCA and high-grade LAD stenosis and moderate circumflex stenosis with preserved LV function. Echocardiogram demonstrated no significant valvular disease. The patient is currently stable without chest pain.  His risk factors include uncontrolled and newly diagnosed diabetes mellitus, poorly controlled hypertension, obesity, and dyslipidemia.  Dr. Donata ClayVan Trigt discussed the need for coronary artery bypass grafting surgery. Potential risks, benefits, and complications were discussed with the patient and he agreed to proceed with surgery. Pre operative carotid duplex US showed no significant internal  carotid artery stenosis bilaterally. He underwent a CABG x 4 on 08/30/2014.  Brief Hospital Course:  The patient was extubated late the evening of surgery without difficulty. He remained afebrile and hemodynamically stable. He had a fair amount of incisional pain. Toradol helped with this. He was weaned of Nitro drip (open left radial artery harvest) and Neo synephrine drips. Imdur was started.[name] Ganz, a line, chest tubes, and foley were removed early in the post operative course. Lopressor was started and titrated accordingly. He was volume over loaded and diuresed. He did have ABL anemia post op. He did not require a post op transfusion. He was weaned off the insulin drip. Once he was tolerating a diet, home diabetic medicines were restarted. The patient's HGA1C pre op was 8.4. His glucose was well controlled post op. He will need to obtain a medical doctor for further surveillance of his HGA1C and diabetes management. The patient was felt surgically stable for transfer from the ICU to PCTU for further convalescence on 09/01/2014. He continues to progress with cardiac rehab. He was ambulating on room air. He has been tolerating a diet and has had a bowel movement. Epicardial pacing wires and chest tube sutures will be removed prior to discharge. He is stable for discharge on 09/04/2014    Latest Vital Signs: Blood pressure 115/67, pulse 102, temperature 98.3 F (36.8 C), temperature source Oral, resp. rate 21, height 6' (1.829 m), weight 298 lb 14.4 oz (135.58 kg), SpO2 97 %.  Physical Exam: General appearance: alert, cooperative and no distress Heart: RRR Lungs: Diminished at bases Abdomen: soft, non-tender; bowel sounds normal; no masses, no organomegaly Extremities: Bilateral lower extremity edema Wound: Aqauacel intact;compression dressing over left radial artery harvest.  LLE dressing is clean and dry. Neurologic: intact  Discharge Condition:Stable  Recent laboratory studies:  Lab  Results  Component Value Date   WBC 8.4 09/01/2014   HGB 10.8* 09/01/2014   HCT 31.9* 09/01/2014   MCV 88.1 09/01/2014   PLT 142* 09/01/2014   Lab Results  Component Value Date   NA 135 09/01/2014   K 4.2 09/01/2014   CL 102 09/01/2014   CO2 26 09/01/2014   CREATININE 1.05 09/01/2014   GLUCOSE 162* 09/01/2014    Diagnostic Studies: Dg Chest Port 1 View  09/01/2014   CLINICAL DATA:  49 year old male post CABG.  Subsequent encounter.  EXAM: PORTABLE CHEST - 1 VIEW  COMPARISON:  08/31/2014 and 08/30/2014  FINDINGS: Swan-Ganz catheter and left-sided chest tube and been removed. No gross pneumothorax noted. Mediastinal drain remains in place.  Decreased degree of inspiration. Progress of prominence of the mediastinal and cardiac silhouette may be related to decreased level of inspiration. Attention to this on follow up.  Pulmonary vascular congestion. Left base atelectasis/small pleural effusion.  IMPRESSION: Swan-Ganz catheter and left-sided chest tube with removed.  Decrease degree of inspiration with progressive prominence of mediastinal and cardiac silhouette.  Pulmonary vascular congestion slightly more notable than on prior exam.  Basilar subsegmental atelectasis. Small left-sided pleural effusion not excluded.   Electronically Signed   By: [name] M.D.   On: 09/01/2014 08:04      Medication List    STOP taking these medications        losartan 50 MG tablet  Commonly known as:  COZAAR      TAKE these medications        aspirin 325 MG EC tablet  Take 1 tablet (325 mg total) by mouth daily.     ezetimibe 10 MG tablet  Commonly known as:  ZETIA  Take 1 tablet (10 mg total) by mouth daily.     hydrochlorothiazide 25 MG tablet  Commonly known as:  HYDRODIURIL  Take 25 mg by mouth daily.     isosorbide mononitrate 30 MG 24 hr tablet  Commonly known as:  IMDUR  Take 0.5 tablets (15 mg total) by mouth daily.     lisinopril 5 MG tablet  Commonly known as:  PRINIVIL,ZESTRIL   Take 1 tablet (5 mg total) by mouth daily.     metFORMIN 500 MG tablet  Commonly known as:  GLUCOPHAGE  Take 1 tablet (500 mg total) by mouth 2 (two) times daily with a meal.     metoprolol succinate 50 MG 24 hr tablet  Commonly known as:  TOPROL-XL  Take 1 tablet (50 mg total) by mouth 2 (two) times daily. Take with or immediately following a meal.     oxyCODONE 5 MG immediate release tablet  Commonly known as:  Oxy IR/ROXICODONE  Take 1-2 tablets (5-10 mg total) by mouth every 4 (four) hours as needed for severe pain.     simvastatin 20 MG tablet  Commonly known as:  ZOCOR  Take 1 tablet (20 mg total) by mouth daily at 6 PM.       The patient has been discharged on:   1.Beta Blocker:  Yes [  x ]                              No   [   ]  If No, reason:  2.Ace Inhibitor/ARB: Yes [x   ]                                     No  [    ]                                     If No, reason:  3.Statin:   Yes [  x ]                  No  [   ]                  If No, reason:  4.Ecasa:  Yes  [ x ]                  No   [   ]                  If No, reason: Follow-up Information    Follow up with Lamar Blinks, MD.   Specialty:  Internal Medicine   Why:  Call for a follow up appointment for 2 weeks   Contact information:   [address] [place] [address] [phone]       Follow up with Medical Doctor.   Why:  Obtain a medical doctor for further surveillance of HGA1C 8.4 and further diabetes management      Follow up with VAN Dinah Beers, MD On 10/12/2014.   Specialty:  Cardiothoracic Surgery   Why:  PA/LAT CXR to be taken at [facility] which is in the same building as Dr. Zenaida Niece Trigt's office) on 10/12/2014 at 1:00 pm;Appointment with Dr. Donata Clay is at 2:00 pm   Contact information:   [address] [place] [address] [phone]       Follow up with [facility] [place].   Why:  nurse appy for staple  removal- office will contact   Contact information:   [address] [place] [address]       Signed: Doree Fudge MPA-C 09/01/2014, 1:35 PM

## 2014-09-01 NOTE — Plan of Care (Signed)
Problem: Phase II - Intermediate Post-Op Goal: Pain controlled with appropriate interventions Outcome: Completed/Met Date Met:  09/01/14 Pain controlled, rates pain 2-3 out of a 10 point scale.  Goal: Activity Progressed Outcome: Completed/Met Date Met:  09/01/14 Pt ambulates in hallway, up with minimal assist.

## 2014-09-02 ENCOUNTER — Inpatient Hospital Stay (HOSPITAL_COMMUNITY): Payer: 59

## 2014-09-02 ENCOUNTER — Encounter (HOSPITAL_COMMUNITY): Payer: Self-pay | Admitting: General Practice

## 2014-09-02 LAB — GLUCOSE, CAPILLARY
Glucose-Capillary: 125 mg/dL — ABNORMAL HIGH (ref 70–99)
Glucose-Capillary: 116 mg/dL — ABNORMAL HIGH (ref 70–99)
Glucose-Capillary: 126 mg/dL — ABNORMAL HIGH (ref 70–99)
Glucose-Capillary: 113 mg/dL — ABNORMAL HIGH (ref 70–99)

## 2014-09-02 LAB — BASIC METABOLIC PANEL
Anion gap: 5 (ref 5–15)
BUN: 18 mg/dL (ref 6–23)
CO2: 29 mmol/L (ref 19–32)
Calcium: 7.7 mg/dL — ABNORMAL LOW (ref 8.4–10.5)
Chloride: 99 mEq/L (ref 96–112)
Creatinine, Ser: 1.01 mg/dL (ref 0.50–1.35)
GFR calc Af Amer: >90 mL/min (ref >90)
GFR calc non Af Amer: 85 mL/min — ABNORMAL LOW (ref >90)
Glucose, Bld: 140 mg/dL — ABNORMAL HIGH (ref 70–99)
Potassium: 3.9 mmol/L (ref 3.5–5.1)
Sodium: 133 mmol/L — ABNORMAL LOW (ref 135–145)

## 2014-09-02 LAB — CBC
HCT: 27.4 % — ABNORMAL LOW (ref 39.0–52.0)
Hemoglobin: 9.2 g/dL — ABNORMAL LOW (ref 13.0–17.0)
MCH: 29.2 pg (ref 26.0–34.0)
MCHC: 33.6 g/dL (ref 30.0–36.0)
MCV: 87 fL (ref 78.0–100.0)
Platelets: 172 10*3/uL (ref 150–400)
RBC: 3.15 MIL/uL — ABNORMAL LOW (ref 4.22–5.81)
RDW: 13.6 % (ref 11.5–15.5)
WBC: 7.6 10*3/uL (ref 4.0–10.5)

## 2014-09-02 MED ORDER — POTASSIUM CHLORIDE CRYS ER 20 MEQ PO TBCR
20.0000 meq | EXTENDED_RELEASE_TABLET | Freq: Every day | ORAL | Status: DC
  Administered 2014-09-02 – 2014-09-04 (×3): 20 meq via ORAL
  Filled 2014-09-02 (×3): qty 1

## 2014-09-02 MED ORDER — EZETIMIBE 10 MG PO TABS
10.0000 mg | ORAL_TABLET | Freq: Every day | ORAL | Status: DC
Start: 2014-09-02 — End: 2014-09-04
  Administered 2014-09-02 – 2014-09-04 (×3): 10 mg via ORAL
  Filled 2014-09-02 (×3): qty 1

## 2014-09-02 MED ORDER — DIPHENHYDRAMINE HCL 25 MG PO CAPS
25.0000 mg | ORAL_CAPSULE | Freq: Every evening | ORAL | Status: DC | PRN
  Administered 2014-09-02: 25 mg via ORAL
  Filled 2014-09-02: qty 1

## 2014-09-02 MED ORDER — LISINOPRIL 2.5 MG PO TABS
2.5000 mg | ORAL_TABLET | Freq: Every day | ORAL | Status: DC
  Administered 2014-09-02: 2.5 mg via ORAL
  Filled 2014-09-02 (×2): qty 1

## 2014-09-02 MED ORDER — FUROSEMIDE 80 MG PO TABS
80.0000 mg | ORAL_TABLET | Freq: Every day | ORAL | Status: DC
  Administered 2014-09-02 – 2014-09-04 (×3): 80 mg via ORAL
  Filled 2014-09-02 (×3): qty 1

## 2014-09-02 NOTE — Progress Notes (Signed)
  RD consulted for nutrition education regarding diabetes.   Lab Results  Component Value Date   HGBA1C 8.4* 08/29/2014    RD provided "Carbohydrate Counting for People with Diabetes" handout from the Academy of Nutrition and Dietetics. Discussed different food groups and their effects on blood sugar, emphasizing carbohydrate-containing foods. Provided list of carbohydrates and recommended serving sizes of common foods. Provided "My Plate Method" handout with suggestions on how to balance meals.   Discussed importance of controlled and consistent carbohydrate intake throughout the day. Provided examples of ways to balance meals/snacks and encouraged intake of high-fiber, whole grain complex carbohydrates. Teach back method used.  Expect very good compliance.  Body mass index is 40.35 kg/(m^2). Pt meets criteria for Obesity based on current BMI.  Current diet order is Carb Modified, patient is consuming approximately 100% of meals at this time. Labs and medications reviewed. No further nutrition interventions warranted at this time. RD contact information provided. If additional nutrition issues arise, please re-consult RD.  Ian Malkineanne Barnett RD, LDN Inpatient Clinical Dietitian Pager: [phone] After Hours Pager: [phone]

## 2014-09-02 NOTE — Progress Notes (Addendum)
      [address]       [place] [id number]             [phone]        3 Days Post-Op Procedure(s) (LRB): CORONARY ARTERY BYPASS GRAFTING (CABG) x  four,   using left internal mammary artery, left radial artery and right leg greater saphenous vein harvested endoscopically (N/A) INTRAOPERATIVE TRANSESOPHAGEAL ECHOCARDIOGRAM (N/A) RADIAL ARTERY HARVEST (Left)  Subjective: Patient did not sleep well and has loose stools.  Objective: Vital signs in last 24 hours: Temp:  [98.3 F (36.8 C)-98.7 F (37.1 C)] 98.3 F (36.8 C) (01/22 0538) Pulse Rate:  [96-102] 96 (01/22 0538) Cardiac Rhythm:  [-] Normal sinus rhythm;Sinus tachycardia (01/21 2044) Resp:  [16-23] 19 (01/22 0538) BP: (108-134)/(57-75) 132/70 mmHg (01/22 0538) SpO2:  [94 %-99 %] 96 % (01/22 0538) Weight:  [297 lb 9.6 oz (134.99 kg)] 297 lb 9.6 oz (134.99 kg) (01/22 0500)  Pre op weight 129 kg Current Weight  09/02/14 297 lb 9.6 oz (134.99 kg)      Intake/Output from previous day: 01/21 0701 - 01/22 0700 In: 380 [P.O.:360; I.V.:20] Out: 620 [Urine:600; Chest Tube:20]   Physical Exam:  Cardiovascular: RRR, no murmurs, gallops, or rubs. Pulmonary: Diminished at bases bilaterally; no rales, wheezes, or rhonchi. Abdomen: Soft, non tender, bowel sounds present. Extremities: Mild bilateral lower extremity edema. Wounds: All dressings removed and wounds clean and dry.  No erythema or signs of infection.  Lab Results: CBC: Recent Labs  08/31/14 1658 09/01/14 0253  WBC 12.0* 8.4  HGB 10.8* 10.8*  HCT 32.7* 31.9*  PLT 188 142*   BMET:  Recent Labs  08/31/14 0400 08/31/14 1658 09/01/14 0253  NA 139  --  135  K 4.1  --  4.2  CL 108  --  102  CO2 26  --  26  GLUCOSE 116*  --  162*  BUN 11  --  15  CREATININE 0.91 1.20 1.05  CALCIUM 7.3*  --  7.5*    PT/INR:  Lab Results  Component Value Date   INR 1.23 08/30/2014   ABG:  INR: Will add last result for INR, ABG once  components are confirmed Will add last 4 CBG results once components are confirmed  Assessment/Plan:  1. CV - SR in the 90's. On Lopressor 25 bid, Imdur 15 daily. Start low dose ACE as BP improved 2.  Pulmonary - On 2 liters of oxygen via [place] as tolerates.Encourage incentive spirometer 3. Volume Overload - On Lasix 40 daily 4.  Acute blood loss anemia - Last H and H stable at 10.8 and 31.9 5.DM-CBGs 156/121/125. On Metformin 500 bid. Pre op HGA1C 8.4. Will need follow up with medical doctor after discharge. 6. Remove EPW in am 7. Stop stool softeners 8. Benadryl PRN sleep 9. Likely discharge on Sunday  ZIMMERMAN,DONIELLE MPA-C 09/02/2014,7:14 AM  patient examined and medical record reviewed,agree with above note. Prob home on metformin 500 BID He needs Rx for glucometer @ DC on Sun VAN TRIGT III,PETER 09/02/2014

## 2014-09-02 NOTE — Progress Notes (Signed)
Pt ambulated approximately 300 ft with assist x1, RW, and 2L O2.  Pt tolerated well without complaints.  Pt back to bed with call bell in reach, will continue to monitor.

## 2014-09-02 NOTE — Progress Notes (Signed)
CARDIAC REHAB PHASE I   PRE:  Rate/Rhythm: 96 SR  BP:  Sitting: 109/69      SaO2: 99 2L  MODE:  Ambulation: 490 ft   POST:  Rate/Rhythm: 99 SR  BP:  Sitting: 123/82     SaO2: 99 2L 0950-1100 Patient ambulated in hallway independently with RW. Steady gait noted. No rest breaks taken. Denied complaints. Discharge education completed with teach back noted. Post walk patient back to recliner with call bell and phone in reach.  Maude LerichePayne, Nyles Mitton EnglishRN, BSN 09/02/2014 10:54 AM

## 2014-09-03 LAB — GLUCOSE, CAPILLARY
Glucose-Capillary: 115 mg/dL — ABNORMAL HIGH (ref 70–99)
Glucose-Capillary: 131 mg/dL — ABNORMAL HIGH (ref 70–99)
Glucose-Capillary: 109 mg/dL — ABNORMAL HIGH (ref 70–99)
Glucose-Capillary: 117 mg/dL — ABNORMAL HIGH (ref 70–99)

## 2014-09-03 LAB — BASIC METABOLIC PANEL
Anion gap: 6 (ref 5–15)
BUN: 17 mg/dL (ref 6–23)
CO2: 29 mmol/L (ref 19–32)
Calcium: 8.2 mg/dL — ABNORMAL LOW (ref 8.4–10.5)
Chloride: 102 mmol/L (ref 96–112)
Creatinine, Ser: 0.99 mg/dL (ref 0.50–1.35)
GFR calc Af Amer: >90 mL/min (ref >90)
GFR calc non Af Amer: >90 mL/min (ref >90)
Glucose, Bld: 131 mg/dL — ABNORMAL HIGH (ref 70–99)
Potassium: 4.1 mmol/L (ref 3.5–5.1)
Sodium: 137 mmol/L (ref 135–145)

## 2014-09-03 LAB — CBC
HCT: 30.2 % — ABNORMAL LOW (ref 39.0–52.0)
Hemoglobin: 10 g/dL — ABNORMAL LOW (ref 13.0–17.0)
MCH: 29.9 pg (ref 26.0–34.0)
MCHC: 33.1 g/dL (ref 30.0–36.0)
MCV: 90.1 fL (ref 78.0–100.0)
Platelets: 227 10*3/uL (ref 150–400)
RBC: 3.35 MIL/uL — ABNORMAL LOW (ref 4.22–5.81)
RDW: 13.6 % (ref 11.5–15.5)
WBC: 6.9 10*3/uL (ref 4.0–10.5)

## 2014-09-03 MED ORDER — LISINOPRIL 5 MG PO TABS
5.0000 mg | ORAL_TABLET | Freq: Every day | ORAL | Status: DC
  Administered 2014-09-03 – 2014-09-04 (×2): 5 mg via ORAL
  Filled 2014-09-03 (×2): qty 1

## 2014-09-03 NOTE — Progress Notes (Signed)
Patient ambulated in the hallway 350 feet. Patient tolerated well. Returned to room with call bell within reach.

## 2014-09-03 NOTE — Progress Notes (Signed)
Removed EPW per MD order per hospital policy. Pacing wires intact, VSS 134/72 BP, 108 HR, patient tolerated well. Patient reminded to remain in bed for one hour. Will continue to monitor closely.

## 2014-09-03 NOTE — Progress Notes (Signed)
CARDIAC REHAB PHASE I   PRE:  Rate/Rhythm: 109   BP:  Sitting: 138/70     SaO2: 96% RA  MODE:  Ambulation: 1,300 ft   POST:  Rate/Rhythm: 115  BP:  Sitting: 128/80     SaO2: 99%  10:55am-11:15am  Patient walked independently at a stable slow pace.  Patient did not complain of any chest pain or discomfort.  Patient's only complaint is that he is weak.    Bland SpanBrady, Lilianah Buffin D, MS 09/03/2014 11:11 AM

## 2014-09-03 NOTE — Progress Notes (Addendum)
[address]       [place] [id number]             [phone]      4 Days Post-Op Procedure(s) (LRB): CORONARY ARTERY BYPASS GRAFTING (CABG) x  four,   using left internal mammary artery, left radial artery and right leg greater saphenous vein harvested endoscopically (N/A) INTRAOPERATIVE TRANSESOPHAGEAL ECHOCARDIOGRAM (N/A) RADIAL ARTERY HARVEST (Left) Subjective: Feels well  Objective: Vital signs in last 24 hours: Temp:  [98.2 F (36.8 C)-98.9 F (37.2 C)] 98.2 F (36.8 C) (01/23 0614) Pulse Rate:  [90-102] 95 (01/23 0614) Cardiac Rhythm:  [-] Normal sinus rhythm;Sinus tachycardia (01/22 1920) Resp:  [18-19] 18 (01/23 0614) BP: (121-134)/(68-83) 121/68 mmHg (01/23 0614) SpO2:  [92 %-100 %] 92 % (01/23 0614) Weight:  [294 lb 15.6 oz (133.8 kg)] 294 lb 15.6 oz (133.8 kg) (01/23 0450)  Hemodynamic parameters for last 24 hours:    Intake/Output from previous day: 01/22 0701 - 01/23 0700 In: 720 [P.O.:720] Out: 2400 [Urine:2400] Intake/Output this shift:    General appearance: alert, cooperative and no distress Heart: regular rate and rhythm Lungs: clear to auscultation bilaterally Abdomen: benign Extremities: + LE edema Wound: incis healing well, left hand N/V intact  Lab Results:  Recent Labs  09/02/14 0656 09/03/14 0412  WBC 7.6 6.9  HGB 9.2* 10.0*  HCT 27.4* 30.2*  PLT 172 227   BMET:  Recent Labs  09/02/14 0656 09/03/14 0412  NA 133* 137  K 3.9 4.1  CL 99 102  CO2 29 29  GLUCOSE 140* 131*  BUN 18 17  CREATININE 1.01 0.99  CALCIUM 7.7* 8.2*    PT/INR: No results for input(s): LABPROT, INR in the last 72 hours. ABG    Component Value Date/Time   PHART 7.309* 08/31/2014 0309   HCO3 24.8* 08/31/2014 0309   TCO2 26.3 08/31/2014 0309   ACIDBASEDEF 0.7 08/31/2014 0309   O2SAT 95.0 08/31/2014 0309   CBG (last 3)   Recent Labs  09/02/14 1619 09/02/14 2032 09/03/14 0611  GLUCAP 126* 113* 115*    Meds Scheduled  Meds: . acetaminophen  1,000 mg Oral 4 times per day   Or  . acetaminophen (TYLENOL) oral liquid 160 mg/5 mL  1,000 mg Per Tube 4 times per day  . aspirin EC  325 mg Oral Daily  . Chlorhexidine Gluconate Cloth  6 each Topical Daily  . ezetimibe  10 mg Oral Daily  . furosemide  80 mg Oral Daily  . insulin aspart  0-24 Units Subcutaneous TID AC & HS  . isosorbide mononitrate  15 mg Oral Daily  . lisinopril  2.5 mg Oral Daily  . metFORMIN  500 mg Oral BID WC  . metoprolol tartrate  25 mg Oral BID  . mupirocin ointment  1 application Nasal BID  . pantoprazole  40 mg Oral Daily  . pneumococcal 23 valent vaccine  0.5 mL Intramuscular Tomorrow-1000  . potassium chloride  20 mEq Oral Daily  . simvastatin  20 mg Oral q1800  . sodium chloride  3 mL Intravenous Q12H  . sodium chloride  3 mL Intravenous Q12H  . sodium chloride  3 mL Intravenous Q12H   Continuous Infusions:  PRN Meds:.sodium chloride, sodium chloride, diphenhydrAMINE, metoprolol, ondansetron (ZOFRAN) IV, oxyCODONE, sodium chloride, sodium chloride, sodium chloride, traMADol  Xrays Dg Chest 2 View  09/02/2014   CLINICAL DATA:  Chest soreness, post CABG x 4  EXAM: CHEST  2 VIEW  COMPARISON:  09/01/2014  FINDINGS: Upper normal size of cardiac silhouette post CABG.  Mediastinal contours normal.  Slight pulmonary vascular congestion.  Bibasilar atelectasis and effusions greater on LEFT.  Upper lungs clear.  No pneumothorax.  Bones unremarkable.  IMPRESSION: Bibasilar pleural effusions and atelectasis LEFT greater than RIGHT, little changed.   Electronically Signed   By: Ulyses Southward M.D.   On: 09/02/2014 08:51    Assessment/Plan: S/P Procedure(s) (LRB): CORONARY ARTERY BYPASS GRAFTING (CABG) x  four,   using left internal mammary artery, left radial artery and right leg greater saphenous vein harvested endoscopically (N/A) INTRAOPERATIVE TRANSESOPHAGEAL ECHOCARDIOGRAM (N/A) RADIAL ARTERY HARVEST (Left)  1 feels well 2 hemodyn  stable in sinus rhythm, can increase lisinopril a little 3 labs stable 4 sugars controlled on metformin well 5 cont diuresis for volume overload 6 poss home in am- d/c epw's    LOS: 7 days    Earl Riddle,Earl Riddle 09/03/2014  I have seen and examined Earl Riddle and agree with the above assessment  and plan.  Delight Ovens MD Beeper [phone] Office [phone] 09/03/2014 11:26 AM

## 2014-09-03 NOTE — Progress Notes (Signed)
Patient ambulated 300 ft using front wheel walker on room air tolerated well.

## 2014-09-04 LAB — GLUCOSE, CAPILLARY: Glucose-Capillary: 131 mg/dL — ABNORMAL HIGH (ref 70–99)

## 2014-09-04 MED ORDER — BLOOD GLUCOSE MONITOR KIT: PACK | Status: AC

## 2014-09-04 MED ORDER — METFORMIN HCL 500 MG PO TABS: 500.0000 mg | ORAL_TABLET | Freq: Two times a day (BID) | ORAL | Status: AC

## 2014-09-04 MED ORDER — OXYCODONE HCL 5 MG PO TABS: 5.0000 mg | ORAL_TABLET | ORAL | Status: DC | PRN

## 2014-09-04 MED ORDER — ISOSORBIDE MONONITRATE ER 30 MG PO TB24: 15.0000 mg | ORAL_TABLET | Freq: Every day | ORAL | Status: DC

## 2014-09-04 MED ORDER — EZETIMIBE 10 MG PO TABS: 10.0000 mg | ORAL_TABLET | Freq: Every day | ORAL | Status: DC

## 2014-09-04 MED ORDER — LISINOPRIL 5 MG PO TABS: 5.0000 mg | ORAL_TABLET | Freq: Every day | ORAL | Status: AC

## 2014-09-04 MED ORDER — ASPIRIN 325 MG PO TBEC: 325.0000 mg | DELAYED_RELEASE_TABLET | Freq: Every day | ORAL | Status: AC

## 2014-09-04 MED ORDER — SIMVASTATIN 20 MG PO TABS: 20.0000 mg | ORAL_TABLET | Freq: Every day | ORAL | Status: DC

## 2014-09-04 MED ORDER — METOPROLOL SUCCINATE ER 50 MG PO TB24: 50.0000 mg | ORAL_TABLET | Freq: Two times a day (BID) | ORAL | Status: AC

## 2014-09-04 NOTE — Progress Notes (Addendum)
[address]       [place] [id number]             [phone]      5 Days Post-Op Procedure(s) (LRB): CORONARY ARTERY BYPASS GRAFTING (CABG) x  four,   using left internal mammary artery, left radial artery and right leg greater saphenous vein harvested endoscopically (N/A) INTRAOPERATIVE TRANSESOPHAGEAL ECHOCARDIOGRAM (N/A) RADIAL ARTERY HARVEST (Left) Subjective: conts to progress nicely, no new issues   Objective: Vital signs in last 24 hours: Temp:  [98.1 F (36.7 C)-98.4 F (36.9 C)] 98.1 F (36.7 C) (01/24 0526) Pulse Rate:  [95-108] 100 (01/24 0526) Cardiac Rhythm:  [-] Normal sinus rhythm;Sinus tachycardia (01/23 1930) Resp:  [18-20] 20 (01/24 0526) BP: (106-121)/(65-74) 121/74 mmHg (01/24 0526) SpO2:  [93 %-94 %] 94 % (01/24 0526) Weight:  [288 lb 12.8 oz (131 kg)] 288 lb 12.8 oz (131 kg) (01/24 0526)  Hemodynamic parameters for last 24 hours:    Intake/Output from previous day: 01/23 0701 - 01/24 0700 In: 480 [P.O.:480] Out: 1300 [Urine:1300] Intake/Output this shift:    General appearance: alert, cooperative and no distress Heart: regular rate and rhythm Lungs: clear to auscultation bilaterally Abdomen: benign Extremities: min edema Wound: incis healing well   Lab Results:  Recent Labs  09/02/14 0656 09/03/14 0412  WBC 7.6 6.9  HGB 9.2* 10.0*  HCT 27.4* 30.2*  PLT 172 227   BMET:  Recent Labs  09/02/14 0656 09/03/14 0412  NA 133* 137  K 3.9 4.1  CL 99 102  CO2 29 29  GLUCOSE 140* 131*  BUN 18 17  CREATININE 1.01 0.99  CALCIUM 7.7* 8.2*    PT/INR: No results for input(s): LABPROT, INR in the last 72 hours. ABG    Component Value Date/Time   PHART 7.309* 08/31/2014 0309   HCO3 24.8* 08/31/2014 0309   TCO2 26.3 08/31/2014 0309   ACIDBASEDEF 0.7 08/31/2014 0309   O2SAT 95.0 08/31/2014 0309   CBG (last 3)   Recent Labs  09/03/14 1614 09/03/14 2136 09/04/14 0606  GLUCAP 109* 117* 131*     Meds Scheduled Meds: . acetaminophen  1,000 mg Oral 4 times per day   Or  . acetaminophen (TYLENOL) oral liquid 160 mg/5 mL  1,000 mg Per Tube 4 times per day  . aspirin EC  325 mg Oral Daily  . ezetimibe  10 mg Oral Daily  . furosemide  80 mg Oral Daily  . insulin aspart  0-24 Units Subcutaneous TID AC & HS  . isosorbide mononitrate  15 mg Oral Daily  . lisinopril  5 mg Oral Daily  . metFORMIN  500 mg Oral BID WC  . metoprolol tartrate  25 mg Oral BID  . mupirocin ointment  1 application Nasal BID  . pantoprazole  40 mg Oral Daily  . potassium chloride  20 mEq Oral Daily  . simvastatin  20 mg Oral q1800  . sodium chloride  3 mL Intravenous Q12H  . sodium chloride  3 mL Intravenous Q12H  . sodium chloride  3 mL Intravenous Q12H   Continuous Infusions:  PRN Meds:.sodium chloride, sodium chloride, diphenhydrAMINE, metoprolol, ondansetron (ZOFRAN) IV, oxyCODONE, sodium chloride, sodium chloride, sodium chloride, traMADol  Xrays Dg Chest 2 View  09/02/2014   CLINICAL DATA:  Chest soreness, post CABG x 4  EXAM: CHEST  2 VIEW  COMPARISON:  09/01/2014  FINDINGS: Upper normal size of cardiac silhouette post CABG.  Mediastinal contours normal.  Slight pulmonary vascular congestion.  Bibasilar atelectasis and effusions greater on LEFT.  Upper lungs clear.  No pneumothorax.  Bones unremarkable.  IMPRESSION: Bibasilar pleural effusions and atelectasis LEFT greater than RIGHT, little changed.   Electronically Signed   By: Ulyses Southward M.D.   On: 09/02/2014 08:51    Assessment/Plan: S/P Procedure(s) (LRB): CORONARY ARTERY BYPASS GRAFTING (CABG) x  four,   using left internal mammary artery, left radial artery and right leg greater saphenous vein harvested endoscopically (N/A) INTRAOPERATIVE TRANSESOPHAGEAL ECHOCARDIOGRAM (N/A) RADIAL ARTERY HARVEST (Left) Plan for discharge: see discharge orders   LOS: 8 days    GOLD,WAYNE E 09/04/2014  Waking well in hall without difficulty Plan d/c  today Wounds intact I have seen and examined Hyman Hopes and agree with the above assessment  and plan.  Delight Ovens MD Beeper [phone] Office [phone] 09/04/2014 10:59 AM

## 2014-09-04 NOTE — Progress Notes (Signed)
Removed CT sutures per MD order per hospital policy. Patient tolerated well. Applied benzoin and steri strips to CT suture site.

## 2014-09-04 NOTE — Progress Notes (Signed)
Patient ambulated 550 ft with rolling walker on room air.  Patient tolerated very well.  RN will continue to monitor patient

## 2014-09-05 ENCOUNTER — Other Ambulatory Visit: Payer: Self-pay | Admitting: *Deleted

## 2014-09-05 DIAGNOSIS — R609 Edema, unspecified: Secondary | ICD-10-CM

## 2014-09-05 MED ORDER — HYDROCHLOROTHIAZIDE 25 MG PO TABS: 25.0000 mg | ORAL_TABLET | Freq: Every day | ORAL | Status: DC

## 2014-09-08 ENCOUNTER — Ambulatory Visit (INDEPENDENT_AMBULATORY_CARE_PROVIDER_SITE_OTHER): Payer: Self-pay | Admitting: *Deleted

## 2014-09-08 ENCOUNTER — Other Ambulatory Visit: Payer: Self-pay | Admitting: *Deleted

## 2014-09-08 VITALS — BP 98/71 | HR 108 | Resp 16

## 2014-09-08 DIAGNOSIS — L03115 Cellulitis of right lower limb: Secondary | ICD-10-CM

## 2014-09-08 DIAGNOSIS — I2510 Atherosclerotic heart disease of native coronary artery without angina pectoris: Secondary | ICD-10-CM

## 2014-09-08 DIAGNOSIS — Z4802 Encounter for removal of sutures: Secondary | ICD-10-CM

## 2014-09-08 DIAGNOSIS — Z951 Presence of aortocoronary bypass graft: Secondary | ICD-10-CM

## 2014-09-08 MED ORDER — CEPHALEXIN 500 MG PO CAPS: 500.0000 mg | ORAL_CAPSULE | Freq: Three times a day (TID) | ORAL | Status: DC

## 2014-09-08 NOTE — Progress Notes (Signed)
Mr. Earl Riddle returns for staple removal from his left radial h arvest incision s/p CABG X 4. This was easily done and benzoin and steri strips were applied.  This incision as well as the sternal incision and chest tube sites are very well healed.  His right leg/thighevh site is well healed but there is redness of the site and it is painful for him.  There is also edema of his right leg, particularly the thigh and old bruising.  Dr. Tyrone SageGerhardt saw him, encouraged proper elevation, prescribed Keflex and scheduled him to see Dr. Donata ClayVan Trigt next week for a recheck of his cellulitis.

## 2014-09-12 ENCOUNTER — Ambulatory Visit (INDEPENDENT_AMBULATORY_CARE_PROVIDER_SITE_OTHER): Payer: Self-pay | Admitting: Physician Assistant

## 2014-09-12 ENCOUNTER — Encounter: Payer: Self-pay | Admitting: Physician Assistant

## 2014-09-12 VITALS — BP 105/70 | HR 110 | Temp 97.6°F | Resp 20 | Ht 72.0 in | Wt 288.0 lb

## 2014-09-12 DIAGNOSIS — I2510 Atherosclerotic heart disease of native coronary artery without angina pectoris: Secondary | ICD-10-CM

## 2014-09-12 DIAGNOSIS — Z951 Presence of aortocoronary bypass graft: Secondary | ICD-10-CM

## 2014-09-12 DIAGNOSIS — L03115 Cellulitis of right lower limb: Secondary | ICD-10-CM

## 2014-09-12 NOTE — Progress Notes (Addendum)
  HPI:  Patient returns for follow up regarding bleeding from right thigh EVH wound. He underwent a CABG x 4 on 08/31/2014 by Dr. Prescott Gum. He was last seen in the Office on 09/08/2014 for staple removal from his left forearm (open radial artery harvest wound). At that visit, he was also found to have cellulitis of the right thigh. He was given Keflex and instructed to follow up in one week;however, the right thigh EVH site began to bleed a lot. He presents today for wound right thigh EVH wound check.  Current Outpatient Prescriptions  Medication Sig Dispense Refill  . aspirin EC 325 MG EC tablet Take 1 tablet (325 mg total) by mouth daily. 30 tablet   . blood glucose meter kit and supplies KIT Dispense based on patient and insurance preference. Use up to four times daily as directed. (FOR ICD-9 250.00, 250.01). 1 each 0  . cephALEXin (KEFLEX) 500 MG capsule Take 1 capsule (500 mg total) by mouth 3 (three) times daily. 21 capsule 0  . hydrochlorothiazide (HYDRODIURIL) 25 MG tablet Take 1 tablet (25 mg total) by mouth daily. 30 tablet 0  . isosorbide mononitrate (IMDUR) 30 MG 24 hr tablet Take 0.5 tablets (15 mg total) by mouth daily. 30 tablet 0  . lisinopril (PRINIVIL,ZESTRIL) 5 MG tablet Take 1 tablet (5 mg total) by mouth daily. 30 tablet 1  . metFORMIN (GLUCOPHAGE) 500 MG tablet Take 1 tablet (500 mg total) by mouth 2 (two) times daily with a meal. 60 tablet 1  . metoprolol succinate (TOPROL-XL) 50 MG 24 hr tablet Take 1 tablet (50 mg total) by mouth 2 (two) times daily. Take with or immediately following a meal. 60 tablet 1  . simvastatin (ZOCOR) 20 MG tablet Take 1 tablet (20 mg total) by mouth daily at 6 PM. 30 tablet 1   Vital Signs: BP 105/70, HR 110, RR 20, Temp 97.6, O2 saturation 98% on room air   Physical Exam: CV- Tachycardic Pulmonary- Clear to auscultation bilaterally Extremities- Trace LE edema Wounds- Left forearm is clean and dry and no sign of infection. Right EVH  wound has bloody drainage. There is little erythema. Inner right thigh is firm.  Impression and Plan: After using spray anesthetic, the right thigh EVH was opened. There was both darkened and minor bright red blood evacuated. The patient has a hematoma from previous right saphenous vein harvesting. He was instructed to continue Keflex and take until gone. He does not require further antibiotics at this time. Home health will be arranged to pack the  wound with normal saline damp to dry and change daily. He was instructed if he develops redness, fever, chills, or purulent drainage, to call the office immediately. Otherwise, he will return next Wednesday 2/10 to see Dr. Prescott Gum for further evaluation. Also, at that time, if he remains tachycardic, Lopressor may need to be adjusted. He is currently on 50 bid. If his blood pressure remains labile, may need to decrease or stop Lisinopril.

## 2014-09-14 ENCOUNTER — Ambulatory Visit: Payer: 59 | Admitting: Cardiothoracic Surgery

## 2014-09-20 ENCOUNTER — Other Ambulatory Visit: Payer: Self-pay | Admitting: Cardiothoracic Surgery

## 2014-09-20 DIAGNOSIS — Z951 Presence of aortocoronary bypass graft: Secondary | ICD-10-CM

## 2014-09-21 ENCOUNTER — Ambulatory Visit: Payer: Self-pay | Admitting: Cardiothoracic Surgery

## 2014-09-21 ENCOUNTER — Ambulatory Visit: Payer: Self-pay | Admitting: *Deleted

## 2014-09-21 DIAGNOSIS — L03115 Cellulitis of right lower limb: Secondary | ICD-10-CM

## 2014-09-21 DIAGNOSIS — Z5189 Encounter for other specified aftercare: Secondary | ICD-10-CM

## 2014-09-21 DIAGNOSIS — I2510 Atherosclerotic heart disease of native coronary artery without angina pectoris: Secondary | ICD-10-CM

## 2014-09-21 DIAGNOSIS — Z951 Presence of aortocoronary bypass graft: Secondary | ICD-10-CM

## 2014-09-21 NOTE — Progress Notes (Unsigned)
Mr. Earl Riddle returns for recheck of his right leg EVH site. During his last visit, this area had been opened, excised and packed with dampened n/s packing followed by dry dressing.  This could not be done very deeply.  His thigh was very tight with residual bruising and this continues. The area continues to drain serosanguinous drainage but is not warm to touch and there are no signs of infection.  It is unable to be packed at this point.  I recommended the use of warm compresses and/or heating pad on the lowest setting to help break up the residual bruising/hardness in the thigh.  He will return as scheduled or sooner if this area changes in any way.

## 2014-09-23 ENCOUNTER — Ambulatory Visit: Payer: Self-pay | Admitting: Cardiothoracic Surgery

## 2014-09-27 DIAGNOSIS — Z48812 Encounter for surgical aftercare following surgery on the circulatory system: Secondary | ICD-10-CM

## 2014-09-28 ENCOUNTER — Ambulatory Visit (INDEPENDENT_AMBULATORY_CARE_PROVIDER_SITE_OTHER): Payer: Self-pay | Admitting: Physician Assistant

## 2014-09-28 ENCOUNTER — Ambulatory Visit
Admission: RE | Admit: 2014-09-28 | Discharge: 2014-09-28 | Disposition: A | Discharge status: Home / Self Care | Payer: 59 | Source: Ambulatory Visit | Attending: Cardiothoracic Surgery | Admitting: Cardiothoracic Surgery

## 2014-09-28 VITALS — BP 122/79 | HR 114 | Temp 96.9°F | Resp 16 | Ht 72.0 in | Wt 260.0 lb

## 2014-09-28 DIAGNOSIS — Z951 Presence of aortocoronary bypass graft: Secondary | ICD-10-CM

## 2014-09-28 DIAGNOSIS — L03115 Cellulitis of right lower limb: Secondary | ICD-10-CM

## 2014-09-28 DIAGNOSIS — I2510 Atherosclerotic heart disease of native coronary artery without angina pectoris: Secondary | ICD-10-CM

## 2014-09-28 NOTE — Progress Notes (Signed)
  HPI:  Earl Riddle is S/P CABG x 4 on 08/31/2014.  He developed an EVH site hematoma which was opened and drained and has since resolved.  He presents today with complaints of new onset left lower back pain.  He states that he just developed this.  He can not contribute this to any specific activity, however he has been vacuuming and doing some work around his house.  He is also concerned about not having an appetite at all, but he is eating once meal a day and drinking some shakes to help with his oral intake.  Finally he is concerned regarding some new onset Erectile Dysfunction.  He states that he is able to achieve an erection sometimes and maintain it and then other times he can not.  Current Outpatient Prescriptions  Medication Sig Dispense Refill  . aspirin EC 325 MG EC tablet Take 1 tablet (325 mg total) by mouth daily. 30 tablet   . blood glucose meter kit and supplies KIT Dispense based on patient and insurance preference. Use up to four times daily as directed. (FOR ICD-9 250.00, 250.01). 1 each 0  . isosorbide mononitrate (IMDUR) 30 MG 24 hr tablet Take 0.5 tablets (15 mg total) by mouth daily. 30 tablet 0  . lisinopril (PRINIVIL,ZESTRIL) 5 MG tablet Take 1 tablet (5 mg total) by mouth daily. 30 tablet 1  . metFORMIN (GLUCOPHAGE) 500 MG tablet Take 1 tablet (500 mg total) by mouth 2 (two) times daily with a meal. 60 tablet 1  . metoprolol succinate (TOPROL-XL) 50 MG 24 hr tablet Take 1 tablet (50 mg total) by mouth 2 (two) times daily. Take with or immediately following a meal. 60 tablet 1  . simvastatin (ZOCOR) 20 MG tablet Take 1 tablet (20 mg total) by mouth daily at 6 PM. 30 tablet 1   No current facility-administered medications for this visit.    Physical Exam:  BP 122/79 mmHg  Pulse 114  Temp(Src) 96.9 F (36.1 C) (Oral)  Resp 16  Ht 6' (1.829 m)  Wt 260 lb (117.935 kg)  BMI 35.25 kg/m2  SpO2 98%  Gen: no apparent distress Heart: RRR Lungs: CTA bilaterally   Incisions: healing well  Diagnostic Tests:  CXR: stable post operative changes, trace left posterior pleural effusion   A/P:  1. Left sided low back pain- not acute evidence of source on CXR.  Can most likely be related to an acute muscle injury.  I explained sternal precautions and activities that should be avoided. 2.  Tachycardia- will stops patient's HCTZ, he has lost 30lbs since discharge from hospital and he may likely be dehydrated from poor oral intake.  We can reassess at his post operative follow up and increase his lopressor at that time if no improvement. 3. Erectile Dysfunction- in regards to this I explained to the patient that this can occur in patient's with coronary disease.  However it could also be a side effect of a medication he is taking.  I encouraged the patient to wait 2-3 months and should these symptoms not improve then he can speak with his PCP for medication assistance with this issue 4. Decreased appetite- encouraged PO intake as tolerated 5. RTC with PVT on 3/2

## 2014-10-06 ENCOUNTER — Telehealth: Payer: Self-pay

## 2014-10-06 DIAGNOSIS — E118 Type 2 diabetes mellitus with unspecified complications: Secondary | ICD-10-CM

## 2014-10-06 NOTE — Telephone Encounter (Signed)
Patient called stating that know one from this office has refilled this diabetic supplies and he is out of his strip. I told the patient that he would need to contact his PCP about the diabatic supplies and management. The patient became very irate and hostile over the phone. Stating that we did not care about his situation. I told the patient I would contact the pharmacy with a refill and that he needs to make an appt with his PCP for future Rx's and management of his diabetes. RX for one touch and supplies called to [facility]. Checking glucose levels BID. The glucose meter that was given at hospital had been discontinued per pharmacist.

## 2014-10-11 ENCOUNTER — Ambulatory Visit
Admit: 2014-10-11 | Disposition: A | Discharge status: Home / Self Care | Payer: Self-pay | Attending: Cardiothoracic Surgery | Admitting: Cardiothoracic Surgery

## 2014-10-12 ENCOUNTER — Encounter: Payer: Self-pay | Admitting: Cardiothoracic Surgery

## 2014-10-12 ENCOUNTER — Ambulatory Visit (INDEPENDENT_AMBULATORY_CARE_PROVIDER_SITE_OTHER): Payer: Self-pay | Admitting: Cardiothoracic Surgery

## 2014-10-12 VITALS — BP 129/90 | HR 99 | Resp 16 | Ht 72.0 in | Wt 264.0 lb

## 2014-10-12 DIAGNOSIS — Z951 Presence of aortocoronary bypass graft: Secondary | ICD-10-CM

## 2014-10-12 DIAGNOSIS — I2510 Atherosclerotic heart disease of native coronary artery without angina pectoris: Secondary | ICD-10-CM

## 2014-10-12 NOTE — Progress Notes (Signed)
PCP is Tracie Harrier, MD Referring Provider is Corey Skains, MD  Chief Complaint  Patient presents with  . Routine Post Op    4 wk f/u with cxr s/p CABG 08/30/14    HPI:the patient presents for a final office visit 2 months after multivessel CABG for postinfarction unstable angina. The patient had radial and left IMA grafts as well as 2 vein grafts. His LV function is fairly well preserved. He is done well following surgery. His had no recurrent angina. He had some drainage of a leg hematoma to the endoscopic vein harvest site which is resolved. He has had some numbness on the lateral aspect of his left thigh. The sternal incision is well-healed. He in frequently has any sternal click sensations. He is beginning outpatient cardiac rehabilitation. He is driving. He is also rate to return to work at [employer] expressed office. Patient has new-onset diabetes was discovered during his admission for his MI. He has been very strict with his diet ,has lost at least 25 pounds postop period Past Medical History  Diagnosis Date  . Hypertension   . Diabetes mellitus without complication   . Myocardial infarction     nstemi  . Pneumothorax     Past Surgical History  Procedure Laterality Date  . Coronary artery bypass graft N/A 08/30/2014    Procedure: CORONARY ARTERY BYPASS GRAFTING (CABG) x  four,   using left internal mammary artery, left radial artery and right leg greater saphenous vein harvested endoscopically;  Surgeon: Ivin Poot, MD;  Location: [place];  Service: Open Heart Surgery;  Laterality: N/A;  . Intraoperative transesophageal echocardiogram N/A 08/30/2014    Procedure: INTRAOPERATIVE TRANSESOPHAGEAL ECHOCARDIOGRAM;  Surgeon: Ivin Poot, MD;  Location: [place];  Service: Open Heart Surgery;  Laterality: N/A;  . Radial artery harvest Left 08/30/2014    Procedure: RADIAL ARTERY HARVEST;  Surgeon: Ivin Poot, MD;  Location: [facility];  Service: Open Heart Surgery;  Laterality:  Left;  . Cholecystectomy      No family history on file.  Social History History  Substance Use Topics  . Smoking status: Never Smoker   . Smokeless tobacco: Never Used  . Alcohol Use: Yes     Comment: occasional red wine     Current Outpatient Prescriptions  Medication Sig Dispense Refill  . aspirin EC 325 MG EC tablet Take 1 tablet (325 mg total) by mouth daily. 30 tablet   . blood glucose meter kit and supplies KIT Dispense based on patient and insurance preference. Use up to four times daily as directed. (FOR ICD-9 250.00, 250.01). 1 each 0  . hydrochlorothiazide (HYDRODIURIL) 25 MG tablet Take 25 mg by mouth daily.    Marland Kitchen lisinopril (PRINIVIL,ZESTRIL) 5 MG tablet Take 1 tablet (5 mg total) by mouth daily. 30 tablet 1  . metFORMIN (GLUCOPHAGE) 500 MG tablet Take 1 tablet (500 mg total) by mouth 2 (two) times daily with a meal. 60 tablet 1  . metoprolol succinate (TOPROL-XL) 50 MG 24 hr tablet Take 1 tablet (50 mg total) by mouth 2 (two) times daily. Take with or immediately following a meal. 60 tablet 1  . rosuvastatin (CRESTOR) 20 MG tablet Take 20 mg by mouth daily.     No current facility-administered medications for this visit.    Allergies  Allergen Reactions  . Sulfa Antibiotics Other (See Comments)    Unknown-as child    Review of Systems  Overall doing very well has recovered probably 80% of his  functional capacity. He knows not lift more than 20 pounds until 3 months after surgery.  BP 129/90 mmHg  Pulse 99  Resp 16  Ht 6' (1.829 m)  Wt 264 lb (119.75 kg)  BMI 35.80 kg/m2  SpO2 98% Physical Exam Alert and comfortable Lungs clear Sternal incision well-healed, heart rhythm regular, no murmur Left arm incision well-healed left hand warm with good motor function Leg incision well-healed, no pedal edema Neuro intact  Diagnostic Tests: Last chest x-ray reviewed showing stable cardiac silhouette, sternal wires intact, no pleural  effusion  Impression: Excellent course 2 months postop CABG. Patient understands he should stay on aspirin and Crestor and remain on metformin per his primary physician and remain on metoprolol for his cardiologist.  Plan:return as needed

## 2014-10-14 NOTE — Progress Notes (Signed)
Patient ID: Earl Riddle, male   DOB: [birth year], 49 y.o.   MRN: [id number] Dr. Donata ClayVan Trigt made me aware that Earl Riddle was unhappy with his experience at [facility] and I contacted him to perform service recovery.  I spoke with the patient who stated that overall his experience with [facility] has been superior, from the time of his January admission to this week.  He was, however, very upset with a couple of telephone encounters with one of the [facility] nurses regarding his diabetic test strips.  I listened to understand and explained to the patient that we pride ourselves on customer service and I was very apologetic for his perception of care regarding the diabetic test strips.  I gave him my contact information and encouraged him to call me at any time.  He thanked me and seemed very appreciative of my call and willingness to address his concern.  In addition, I spoke with the clinical staff to understand where the breakdown occurred.

## 2014-10-17 ENCOUNTER — Encounter
Admit: 2014-10-17 | Disposition: A | Discharge status: Home / Self Care | Payer: Self-pay | Attending: Internal Medicine | Admitting: Internal Medicine

## 2014-10-31 ENCOUNTER — Other Ambulatory Visit: Payer: Self-pay | Admitting: Cardiothoracic Surgery

## 2014-11-11 ENCOUNTER — Encounter
Admit: 2014-11-11 | Disposition: A | Discharge status: Home / Self Care | Payer: Self-pay | Attending: Internal Medicine | Admitting: Internal Medicine

## 2014-11-11 ENCOUNTER — Ambulatory Visit
Admit: 2014-11-11 | Disposition: A | Discharge status: Home / Self Care | Payer: Self-pay | Attending: Cardiothoracic Surgery | Admitting: Cardiothoracic Surgery

## 2014-12-11 NOTE — H&P (Signed)
PATIENT NAME:  Earl Riddle, Earl Riddle MR#:  [id number] DATE OF BIRTH:  1966/02/10  DATE OF ADMISSION:  08/25/2014  PRIMARY CARE PROVIDER: Barbette Reichmann, MD   REFERRING PHYSICIAN: Acquanetta Belling, MD    CHIEF COMPLAINT: Left-sided chest pain, left arm weakness,   HISTORY OF PRESENT ILLNESS: The patient is a 49 year old white male with history of hypertension, which apparently has been under poor control.  He started seeing Dr. Marcello Fennel a few months ago.  At that time, his blood pressure was untreated, who states that last week he checked his blood pressure and it was elevated at his friend's house. He figured that when he went to see Dr. Marcello Fennel in February he would address this.  The patient was sleeping last night and he woke up when his dogs woke him up for a walk.  When he started noticing that he was having weakness of the left arm.  At the same time, he was having pressure-like feeling in the left arm and the left chest region.  The symptoms lasted for a few minutes. The patient came to the ED and was evaluated.  He had a CT scan of the head, which was nonrevealing. The patient otherwise now is feeling well and is not having any symptoms.  To note, when he came in, his blood pressure was 205/115.  The patient otherwise also had shortness of breath when he had these symptoms.   PAST MEDICAL HISTORY:  Hypertension.   PAST SURGICAL HISTORY: Cholecystectomy.   ALLERGIES: SULFA.   MEDICATIONS: Hydrochlorothiazide and losartan 12.5/50 at 1 tab p.o. daily.   SOCIAL HISTORY: Does not smoke. Drinks wine once in a while, no drug use.   FAMILY HISTORY: Father had a 4-vessel bypass at age 55.   REVIEW OF SYSTEMS:  CONSTITUTIONAL: Denies any weight loss, weight gain.  HEENT: Denies any blurred vision. No cataracts. No glaucoma, denies any nasal surgeries or seasonal allergies. Denies any epistaxis. No difficulty with hearing. No difficulty swallowing.  CARDIOVASCULAR: Chest pressure as above no syncope. No  arrhythmias.  PULMONARY: Denies any cough, wheezing, hemoptysis. No chronic obstructive pulmonary disease.  GASTROINTESTINAL: Denies any nausea, vomiting, diarrhea. No hematemesis or hematochezia.  GENITOURINARY: Denies any frequency, urgency or hesitancy.  ENDOCRINE: Denies any polyuria, nocturia.  HEMATOLOGIC AND LYMPHATIC: Denies anemia or easy bruisability.  SKIN: No rash.  LYMPHATICS: Denies any lymph node enlargement.  MUSCULOSKELETAL: Denies any erythema, swelling or gout.  NEUROLOGIC: No history of CVA, TIA or seizure.  PSYCHIATRIC: No anxiety, insomnia.   PHYSICAL EXAMINATION:   VITAL SIGNS: Temperature 98.1, pulse 114, respirations 20, blood pressure 205/115.  GENERAL: The patient is a morbidly obese male in no acute distress.  HEENT: Head atraumatic, normocephalic. Pupils equally round, reactive to light and accommodation. There is no conjunctival pallor.  No sclerae icterus. Nasal exam shows no drainage or ulceration.  OROPHARYNX: Clear without any exudate.  NECK: Supple without any JVD. No thyromegaly.  CARDIOVASCULAR: Regular rate and rhythm. No murmurs, rubs, clicks, or gallops.  ABDOMEN: Soft, nontender, nondistended. Positive bowel sounds x 4. No hepatosplenomegaly.  EXTREMITIES: No clubbing, cyanosis, or edema.  SKIN: No rash.  LYMPH NODES: Nonpalpable.  MUSCULOSKELETAL: There is no erythema or swelling.  VASCULAR: Good DP, PT pulses.  PSYCHIATRIC: Not anxious or depressed.  NEUROLOGIC: Awake, alert, and oriented x 3.  No focal deficits.  LYMPH NODES: Nonpalpable.   LABORATORY DATA:  EKG: Normal sinus rhythm without any ST-T wave changes. CT scan of the head shows no  acute abnormality.  Glucose 197, BUN 28, creatinine 1.20, sodium 137, potassium 3.7, chloride 100, CO2 of 29. Troponin less than 0.02. WBC 6.3, hemoglobin 14.9, platelet count 224,000.   ASSESSMENT AND PLAN: The patient is a 49 year old white male who presents with left-sided arm pain, weakness.  1.   Left-sided chest pain, arm pain.  At this time, we will go ahead and place the patient under observation.  If he has 2 cardiac enzymes negative, then a stress maybe later today. Continue aspirin.  I am going to start him on Toprol.  His symptoms could be just related to accelerated hypertension, which we will treat.  2.  Left arm weakness, likely due to #1; however, we will check carotids and echocardiogram.  3.  Accelerated hypertension. I will increase his hydrochlorothiazide dose, continue losartan at the same dose and will add Toprol.  If the patient is doing better, he will be discharged later today with outpatient follow-up with Dr. Marcello FennelHande.    TIME SPENT: 50 minutes.     ____________________________ Lacie ScottsShreyang H. Allena KatzPatel, MD shp:DT D: 08/25/2014 08:33:07 ET T: 08/25/2014 09:04:19 ET JOB#: [id number]  cc: Chloee Tena H. Allena KatzPatel, MD, <Dictator> Charise CarwinSHREYANG H Coben Godshall MD ELECTRONICALLY SIGNED 08/29/2014 12:45

## 2014-12-11 NOTE — Consult Note (Signed)
PATIENT NAME:  Earl Riddle, Earl Riddle MR#:  [id number] DATE OF BIRTH:  Dec 23, 1965  DATE OF CONSULTATION:  08/25/2014  REFERRING PHYSICIAN:   CONSULTING PHYSICIAN:  Lamar BlinksBruce J. Anis Cinelli, MD  REFERRING PHYSICIAN: Dr. Marcello FennelHande.     REASON FOR CONSULTATION: Acute chest discomfort, unstable angina with elevated troponin, essential hypertension.   CHIEF COMPLAINT:  Chest pain.    HISTORY OF PRESENT ILLNESS:  This is a 49 year old male with known essential hypertension who has had appropriate treatment, who awakened with substernal chest discomfort, shortness of breath, weakness, and fatigue, as well as some mild amount of diaphoresis that has improved over a 3 hour period, with an EKG showing normal sinus rhythm and no EKG changes. The patient also had a little elevation of troponin of 0.28 concerning and we have discussed this.  He is currently stable with no evidence of significant symptoms.   REVIEW OF SYSTEMS: The remainder review of systems negative for vision change, ringing in the ears, hearing loss, cough, congestion, heartburn, nausea, vomiting, diarrhea, bloody stool, stomach pain, extremity pain, leg weakness, cramping of the buttocks, known blood clots, headaches, blackouts, dizzy spells, nosebleeds, congestion, trouble swallowing, frequent urination, urination at night, muscle weakness, numbness, anxiety, depression, skin lesions, or skin rashes.   PAST MEDICAL HISTORY:  1.  Essential hypertension.  2.  Mixed hyperlipidemia.   FAMILY HISTORY: Father has had early onset of cardiovascular disease and myocardial infarction.   SOCIAL HISTORY: Currently denies alcohol or tobacco use.   ALLERGIES: AS LISTED.   MEDICATIONS: As listed.   PHYSICAL EXAMINATION:   VITAL SIGNS:  Blood pressure is 124/68 bilaterally, heart rate 72 upright, reclining, and regular.  GENERAL: He is a well-appearing male in no acute distress.  HEAD, EYES, EARS, NOSE, AND THROAT: No icterus, thyromegaly, ulcers, hemorrhage,  or xanthelasma.  CARDIOVASCULAR: Regular rate and rhythm with normal S1 and S2, distant heart sounds. No evidence of murmur, gallop, or rub. PMI is diffuse. Carotid upstroke normal without bruit. Jugular venous pressure is normal.  LUNGS: Clear to auscultation with normal respirations.  ABDOMEN: Soft, nontender, without hepatosplenomegaly or masses. Abdominal aorta is normal size without bruit.  EXTREMITIES: Show 2 + radial, femoral, dorsal pedal pulses, with no lower extremity edema, cyanosis, clubbing, or ulcers.  NEUROLOGIC: He is oriented to time, place, and person, with normal mood and affect.   ASSESSMENT: A 49 year old male with essential hypertension, mixed hyperlipidemia, with elevated troponin and chest pain consistent with unstable angina, versus subendocardial myocardial infarction, needing further treatment options.   RECOMMENDATIONS:  1.  Heparin for further risk reduction of possible myocardial infarction.  2.  Essential hypertension treatment with ACE inhibitor and beta blocker.  3.  Aspirin for further cardiovascular risk reduction.  4.  Possible proceed to cardiac catheterization to assess coronary anatomy and further treatment thereof as necessary. The patient understands the risks and benefits of cardiac catheterization. These include the possibility of death, stroke, heart attack, infection, bleeding or blood clot. He is at low risk for conscious sedation.    ____________________________ Lamar BlinksBruce J. Junia Nygren, MD bjk:bu D: 08/25/2014 13:26:33 ET T: 08/25/2014 14:30:28 ET JOB#: [id number]  cc: Lamar BlinksBruce J. Clary Boulais, MD, <Dictator> Lamar BlinksBRUCE J Rafaella Kole MD ELECTRONICALLY SIGNED 08/29/2014 13:26

## 2014-12-11 NOTE — Discharge Summary (Signed)
PATIENT NAME:  Hyman HopesBENTLEY, Torrin E MR#:  [id number] DATE OF BIRTH:  March 20, 1966  DATE OF ADMISSION:  08/25/2014 DATE OF DISCHARGE:  08/26/2014  DISCHARGE DIAGNOSES:  1.  Chest pain with elevated troponin consistent with non-ST-segment elevation myocardial infarction.  2.  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  3.  Hypertension.  4.  New onset Type 2 diabetes.   CHIEF COMPLAINT: Left-sided chest pain, left arm weakness.   HISTORY OF PRESENT ILLNESS: Edyth GunnelsJeffrey Arvidson is a 49 year old male with a history of hypertension who states that he was woken up by his dogs early yesterday morning and subsequently developed weakness of his left arm and also had pressure-like feeling in his left arm and left chest area. The patient came to the ED and initial CT of the head was nonrevealing, but blood pressure was elevated at 205/115. The patient's cardiac enzyme initially was less than 0.02, but subsequent cardiac enzymes showed troponin of 0.28 and 0.39, respectively. He also had elevated triglycerides of 300, total cholesterol 147 and LDL of 65, and a glucose of 197.   HOSPITAL COURSE: The patient underwent cardiac cath by Dr. 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 [facility]  TRANSFER MEDICATIONS: Metoprolol succinate ER 100 mg a day, losartan 50 mg p.o. b.i.d., aspirin 325 mg a day, HCTZ 25 mg a day, metformin 500 mg p.o. b.i.d.   The patient was chest pain free and stable at time of transfer.   TOTAL TIME SPENT ON DISCHARGING THE PATIENT: 35 minutes.   ____________________________ Barbette ReichmannVishwanath Raenell Mensing, MD vh:sb D: 08/26/2014 16:08:28 ET T: 08/26/2014 16:22:33 ET JOB#: [id number]  cc: Barbette ReichmannVishwanath Rainee Sweatt, MD, <Dictator> Barbette ReichmannVISHWANATH Massimiliano Rohleder MD ELECTRONICALLY SIGNED 09/01/2014 17:46

## 2014-12-12 ENCOUNTER — Encounter: Payer: 59 | Attending: Internal Medicine

## 2014-12-12 DIAGNOSIS — Z951 Presence of aortocoronary bypass graft: Secondary | ICD-10-CM | POA: Insufficient documentation

## 2014-12-14 ENCOUNTER — Encounter: Payer: 59 | Admitting: *Deleted

## 2014-12-14 DIAGNOSIS — Z951 Presence of aortocoronary bypass graft: Secondary | ICD-10-CM

## 2014-12-14 NOTE — Progress Notes (Signed)
Daily Session Note  Patient Details  Name: QUINTARIUS FERNS MRN: [id number] Date of Birth: 11-13-1965 Referring Provider:  Corey Skains, MD  Encounter Date: 12/14/2014  Check In:     Session Check In - 12/14/14 1720    Check-In   Staff Present Gerlene Burdock RN, BSN;Diane Joya Gaskins RN, BSN;Renee Dillard Essex MS, ACSM CEP Exercise Physiologist   ER physicians immediately available to respond to emergencies See telemetry face sheet for immediately available ER MD   Warm-up and Cool-down Performed on first and last piece of equipment   VAD Patient? No   Pain Assessment   Currently in Pain? No/denies         Goals Met:  Independence with exercise equipment Exercise tolerated well No cardiac symptoms reported  Goals Unmet:  Not Applicable  Goals Comments:    Dr. Emily Filbert is Medical Director for [place] and [facility].

## 2014-12-17 NOTE — Addendum Note (Signed)
Addended by: Virgina OrganENTERKIN, Dodger Sinning on: 12/17/2014 08:01 AM   Modules accepted: Medications

## 2014-12-17 NOTE — Progress Notes (Addendum)
Cardiac Individual Treatment Plan  Patient Details  Name: Earl Riddle MRN: [id number] Date of Birth: October 05, 1965 Referring Provider:  Corey Skains, MD  Initial Encounter Date:    Patient's Home Medications on Admission:  Current outpatient prescriptions:  .  aspirin EC 325 MG EC tablet, Take 1 tablet (325 mg total) by mouth daily., Disp: 30 tablet, Rfl:  .  blood glucose meter kit and supplies KIT, Dispense based on patient and insurance preference. Use up to four times daily as directed. (FOR ICD-9 250.00, 250.01)., Disp: 1 each, Rfl: 0 .  hydrochlorothiazide (HYDRODIURIL) 25 MG tablet, Take 25 mg by mouth daily., Disp: , Rfl:  .  lisinopril (PRINIVIL,ZESTRIL) 5 MG tablet, Take 1 tablet (5 mg total) by mouth daily., Disp: 30 tablet, Rfl: 1 .  metFORMIN (GLUCOPHAGE) 500 MG tablet, Take 1 tablet (500 mg total) by mouth 2 (two) times daily with a meal., Disp: 60 tablet, Rfl: 1 .  metoprolol succinate (TOPROL-XL) 50 MG 24 hr tablet, Take 1 tablet (50 mg total) by mouth 2 (two) times daily. Take with or immediately following a meal., Disp: 60 tablet, Rfl: 1 .  rosuvastatin (CRESTOR) 20 MG tablet, Take 20 mg by mouth daily., Disp: , Rfl:   Past Medical History: Past Medical History  Diagnosis Date  . Hypertension   . Diabetes mellitus without complication   . Myocardial infarction     nstemi  . Pneumothorax     Tobacco Use: History  Smoking status  . Never Smoker   Smokeless tobacco  . Never Used    Labs: Recent Review Flowsheet Data    Labs for ITP Cardiac and Pulmonary Rehab Latest Ref Rng 08/30/2014 08/30/2014 08/30/2014 08/30/2014 08/31/2014   PHART 7.350 - 7.450 7.304(L) 7.316(L) - 7.349(L) 7.309(L)   PCO2ART 35.0 - 45.0 mmHg 44.5 42.1 - 40.7 50.8(H)   HCO3 20.0 - 24.0 mEq/L 22.1 21.4 - 22.2 24.8(H)   TCO2 0 - 100 mmol/L _0 26.3   ACIDBASEDEF 0.0 - 2.0 mmol/L 4.0(H) 4.0(H) - 3.0(H) 0.7   O2SAT - 97.0 98.0 - 94.0 95.0       Exercise Target Goals:     Exercise Program Goal: Individual exercise prescription set with THRR, safety & activity barriers. Participant demonstrates ability to understand and report RPE using BORG scale, to self-measure pulse accurately, and to acknowledge the importance of the exercise prescription.  Exercise Prescription Goal: Starting with aerobic activity 30 plus minutes a day, 3 days per week for initial exercise prescription. Provide home exercise prescription and guidelines that participant acknowledges understanding prior to discharge.  Activity Barriers & Risk Stratification:   6 Minute Walk:   Initial Exercise Prescription:   Exercise Prescription Changes:   Discharge Exercise Prescription:   Nutrition:  Target Goals: Understanding of nutrition guidelines, daily intake of sodium <1580m, cholesterol <2048m calories 30% from fat and 7% or less from saturated fats, daily to have 5 or more servings of fruits and vegetables.  Biometrics:    Nutrition Therapy Plan and Nutrition Goals:   Nutrition Discharge: Rate Your Plate Scores:   Nutrition Goals Re-Evaluation:   Psychosocial: Target Goals: Acknowledge presence or absence of depression, maximize coping skills, provide positive support system. Participant is able to verbalize types and ability to use techniques and skills needed for reducing stress and depression.  Initial Review & Psychosocial Screening:   Quality of Life Scores:   PHQ-9: Recent Review Flowsheet Data    There is no flowsheet data to  display.      Psychosocial Evaluation and Intervention:   Psychosocial Re-Evaluation:   Vocational Rehabilitation: Provide vocational rehab assistance to qualifying candidates.   Vocational Rehab Evaluation & Intervention:   Education: Education Goals: Education classes will be provided on a weekly basis, covering required topics. Participant will state understanding/return demonstration of topics presented.  Learning  Barriers/Preferences:   Education Topics: General Nutrition Guidelines/Fats and Fiber: -Group instruction provided by verbal, written material, models and posters to present the general guidelines for heart healthy nutrition. Gives an explanation and review of dietary fats and fiber.   Controlling Sodium/Reading Food Labels: -Group verbal and written material supporting the discussion of sodium use in heart healthy nutrition. Review and explanation with models, verbal and written materials for utilization of the food label.   Exercise Physiology & Risk Factors: - Group verbal and written instruction with models to review the exercise physiology of the cardiovascular system and associated critical values. Details cardiovascular disease risk factors and the goals associated with each risk factor.   Aerobic Exercise & Resistance Training: - Gives group verbal and written discussion on the health impact of inactivity. On the components of aerobic and resistive training programs and the benefits of this training and how to safely progress through these programs.   Flexibility, Balance, General Exercise Guidelines: - Provides group verbal and written instruction on the benefits of flexibility and balance training programs. Provides general exercise guidelines with specific guidelines to those with heart or lung disease. Demonstration and skill practice provided.   Stress Management: - Provides group verbal and written instruction about the health risks of elevated stress, cause of high stress, and healthy ways to reduce stress.   Depression: - Provides group verbal and written instruction on the correlation between heart/lung disease and depressed mood, treatment options, and the stigmas associated with seeking treatment.   Anatomy & Physiology of the Heart: - Group verbal and written instruction and models provide basic cardiac anatomy and physiology, with the coronary electrical and  arterial systems. Review of: AMI, Angina, Valve disease, Heart Failure, Cardiac Arrhythmia, Pacemakers, and the ICD.   Cardiac Procedures: - Group verbal and written instruction and models to describe the testing methods done to diagnose heart disease. Reviews the outcomes of the test results. Describes the treatment choices: Medical Management, Angioplasty, or Coronary Bypass Surgery.   Cardiac Medications: - Group verbal and written instruction to review commonly prescribed medications for heart disease. Reviews the medication, class of the drug, and side effects. Includes the steps to properly store meds and maintain the prescription regimen.   Go Sex-Intimacy & Heart Disease, Get SMART - Goal Setting: - Group verbal and written instruction through game format to discuss heart disease and the return to sexual intimacy. Provides group verbal and written material to discuss and apply goal setting through the application of the S.M.A.R.T. Method.   Other Matters of the Heart: - Provides group verbal, written materials and models to describe Heart Failure, Angina, Valve Disease, and Diabetes in the realm of heart disease. Includes description of the disease process and treatment options available to the cardiac patient.   Exercise & Equipment Safety: - Individual verbal instruction and demonstration of equipment use and safety with use of the equipment.   Infection Prevention: - Provides verbal and written material to individual with discussion of infection control including proper hand washing and proper equipment cleaning during exercise session.   Falls Prevention: - Provides verbal and written material to individual with discussion of  falls prevention and safety.   Diabetes: - Individual verbal and written instruction to review signs/symptoms of diabetes, desired ranges of glucose level fasting, after meals and with exercise. Advice that pre and post exercise glucose checks will be  done for 3 sessions at entry of program.    Knowledge Questionnaire Score:   Personal Goals and Risk Factors at Admission:   Personal Goals and Risk Factors Review:    Personal Goals Discharge:     Comments:

## 2014-12-19 DIAGNOSIS — Z951 Presence of aortocoronary bypass graft: Secondary | ICD-10-CM | POA: Diagnosis not present

## 2014-12-19 NOTE — Progress Notes (Signed)
Daily Session Note  Patient Details  Name: Earl Riddle MRN: [id number] Date of Birth: 09/24/1965 Referring Provider:  Tracie Harrier, MD  Encounter Date: 12/19/2014  Check In:     Session Check In - 12/19/14 1639    Check-In   Staff Present Heath Lark RN, BSN, CCRP;Steven Way BS, ACSM EP-C, Exercise Physiologist;Carroll Enterkin RN, BSN   ER physicians immediately available to respond to emergencies See telemetry face sheet for immediately available ER MD   Warm-up and Cool-down Performed on first and last piece of equipment   VAD Patient? No   Pain Assessment   Currently in Pain? No/denies         Goals Met:  Independence with exercise equipment Exercise tolerated well  Goals Unmet:  Not Applicable  Goals Comments: No complaints of cardiac symptoms today.    Dr. Emily Filbert is Medical Director for [place] and [facility].

## 2014-12-21 ENCOUNTER — Encounter: Payer: 59 | Admitting: *Deleted

## 2014-12-21 DIAGNOSIS — Z951 Presence of aortocoronary bypass graft: Secondary | ICD-10-CM | POA: Diagnosis not present

## 2014-12-21 NOTE — Progress Notes (Signed)
Daily Session Note  Patient Details  Name: Earl Riddle MRN: [id number] Date of Birth: February 03, 1966 Referring Provider:  Tracie Harrier, MD  Encounter Date: 12/21/2014  Check In:     Session Check In - 12/21/14 1609    Check-In   Staff Present Gerlene Burdock RN, BSN;Torry Istre Dillard Essex MS, ACSM CEP Exercise Physiologist;Diane Mariana Arn, BSN   ER physicians immediately available to respond to emergencies See telemetry face sheet for immediately available ER MD   Medication changes reported     No   Fall or balance concerns reported    No   Warm-up and Cool-down Performed on first and last piece of equipment   VAD Patient? No   Pain Assessment   Currently in Pain? No/denies   Multiple Pain Sites No         Goals Met:  Proper associated with RPD/PD & O2 Sat Independence with exercise equipment Exercise tolerated well No report of cardiac concerns or symptoms  Goals Unmet:  Not Applicable  Goals Comments: No cardiac symptoms reported; daily exercise goals completed    Dr. Emily Filbert is Medical Director for [place] and [facility].

## 2014-12-22 DIAGNOSIS — Z951 Presence of aortocoronary bypass graft: Secondary | ICD-10-CM

## 2014-12-22 NOTE — Progress Notes (Signed)
Daily Session Note  Patient Details  Name: Earl Riddle MRN: [id number] Date of Birth: 15-Jul-1966 Referring Provider:  Tracie Harrier, MD  Encounter Date: 12/22/2014  Check In:     Session Check In - 12/22/14 1702    Check-In   Staff Present Lestine Box BS, ACSM EP-C, Exercise Physiologist;Carroll Enterkin RN, BSN;Diane Joya Gaskins RN, BSN   ER physicians immediately available to respond to emergencies See telemetry face sheet for immediately available ER MD   Medication changes reported     No   Fall or balance concerns reported    No   Warm-up and Cool-down Performed on first and last piece of equipment   VAD Patient? No   Pain Assessment   Currently in Pain? No/denies         Goals Met:  Proper associated with RPD/PD & O2 Sat Exercise tolerated well No report of cardiac concerns or symptoms Strength training completed today  Goals Unmet:  Not Applicable  Goals Comments:   Dr. Emily Filbert is Medical Director for [place] and [facility].

## 2014-12-28 ENCOUNTER — Encounter: Payer: 59 | Admitting: *Deleted

## 2014-12-28 DIAGNOSIS — Z951 Presence of aortocoronary bypass graft: Secondary | ICD-10-CM | POA: Diagnosis not present

## 2014-12-28 NOTE — Progress Notes (Signed)
Daily Session Note  Patient Details  Name: Earl Riddle MRN: [id number] Date of Birth: 17-Apr-1966 Referring Provider:  Tracie Harrier, MD  Encounter Date: 12/28/2014  Check In:     Session Check In - 12/28/14 1812    Check-In   Staff Present Gerlene Burdock RN, BSN;Diane Joya Gaskins RN, BSN;Renee Dillard Essex MS, ACSM CEP Exercise Physiologist   ER physicians immediately available to respond to emergencies See telemetry face sheet for immediately available ER MD   Medication changes reported     No   Fall or balance concerns reported    No   Warm-up and Cool-down Performed on first and last piece of equipment   VAD Patient? No   Pain Assessment   Currently in Pain? No/denies   Multiple Pain Sites No         Goals Met:  Independence with exercise equipment Exercise tolerated well No report of cardiac concerns or symptoms  Goals Unmet:  Not Applicable  Goals Comments:    Dr. Emily Filbert is Medical Director for [place] and [facility].

## 2014-12-29 DIAGNOSIS — Z951 Presence of aortocoronary bypass graft: Secondary | ICD-10-CM

## 2014-12-29 NOTE — Progress Notes (Signed)
Daily Session Note  Patient Details  Name: DUY LEMMING MRN: [id number] Date of Birth: Jan 18, 1966 Referring Provider:  Tracie Harrier, MD  Encounter Date: 12/29/2014  Check In:     Session Check In - 12/29/14 1627    Check-In   Staff Present Lestine Box BS, ACSM EP-C, Exercise Physiologist;Carroll Enterkin RN, BSN;Diane Joya Gaskins RN, BSN   ER physicians immediately available to respond to emergencies See telemetry face sheet for immediately available ER MD   Medication changes reported     No   Fall or balance concerns reported    No   Warm-up and Cool-down Performed on first and last piece of equipment   VAD Patient? No   Pain Assessment   Currently in Pain? No/denies         Goals Met:  Proper associated with RPD/PD & O2 Sat Exercise tolerated well No report of cardiac concerns or symptoms Strength training completed today  Goals Unmet:  Not Applicable  Goals Comments:    Dr. Emily Filbert is Medical Director for [facility].

## 2015-01-01 ENCOUNTER — Encounter: Payer: Self-pay | Admitting: *Deleted

## 2015-01-01 NOTE — Progress Notes (Signed)
Input data from previous EMR to update the Individualized Treatment Plan. 

## 2015-01-03 ENCOUNTER — Encounter: Payer: Self-pay | Admitting: *Deleted

## 2015-01-03 NOTE — Progress Notes (Signed)
Cardiac Individual Treatment Plan  Patient Details  Name: Earl Riddle MRN: [id number] Date of Birth: 03-16-1966 Referring Provider: Dr. Mauri Reading Initial Encounter Date:  10/17/2014 Diagnosis   AMI  CABG  Patient's Home Medications on Admission:  Current outpatient prescriptions:  .  aspirin EC 325 MG EC tablet, Take 1 tablet (325 mg total) by mouth daily., Disp: 30 tablet, Rfl:  .  blood glucose meter kit and supplies KIT, Dispense based on patient and insurance preference. Use up to four times daily as directed. (FOR ICD-9 250.00, 250.01)., Disp: 1 each, Rfl: 0 .  hydrochlorothiazide (HYDRODIURIL) 25 MG tablet, Take 25 mg by mouth daily., Disp: , Rfl:  .  lisinopril (PRINIVIL,ZESTRIL) 5 MG tablet, Take 1 tablet (5 mg total) by mouth daily., Disp: 30 tablet, Rfl: 1 .  metFORMIN (GLUCOPHAGE) 500 MG tablet, Take 1 tablet (500 mg total) by mouth 2 (two) times daily with a meal., Disp: 60 tablet, Rfl: 1 .  metoprolol succinate (TOPROL-XL) 50 MG 24 hr tablet, Take 1 tablet (50 mg total) by mouth 2 (two) times daily. Take with or immediately following a meal., Disp: 60 tablet, Rfl: 1 .  rosuvastatin (CRESTOR) 20 MG tablet, Take 20 mg by mouth daily., Disp: , Rfl:   Past Medical History: Past Medical History  Diagnosis Date  . Hypertension   . Diabetes mellitus without complication   . Myocardial infarction     nstemi  . Pneumothorax     Tobacco Use: History  Smoking status  . Never Smoker   Smokeless tobacco  . Never Used    Labs: Recent Review Flowsheet Data    Labs for ITP Cardiac and Pulmonary Rehab Latest Ref Rng 08/30/2014 08/30/2014 08/30/2014 08/30/2014 08/31/2014   PHART 7.350 - 7.450 7.304(L) 7.316(L) - 7.349(L) 7.309(L)   PCO2ART 35.0 - 45.0 mmHg 44.5 42.1 - 40.7 50.8(H)   HCO3 20.0 - 24.0 mEq/L 22.1 21.4 - 22.2 24.8(H)   TCO2 0 - 100 mmol/L _0 26.3   ACIDBASEDEF 0.0 - 2.0 mmol/L 4.0(H) 4.0(H) - 3.0(H) 0.7   O2SAT - 97.0 98.0 - 94.0 95.0        Exercise Target Goals:    Exercise Program Goal: Individual exercise prescription set with THRR, safety & activity barriers. Participant demonstrates ability to understand and report RPE using BORG scale, to self-measure pulse accurately, and to acknowledge the importance of the exercise prescription.  Exercise Prescription Goal: Starting with aerobic activity 30 plus minutes a day, 3 days per week for initial exercise prescription. Provide home exercise prescription and guidelines that participant acknowledges understanding prior to discharge.  Activity Barriers & Risk Stratification:     Activity Barriers & Risk Stratification - 01/01/15 1026    Activity Barriers & Risk Stratification   Activity Barriers None   Risk Stratification High      6 Minute Walk:     6 Minute Walk      10/17/14 1027       6 Minute Walk   Phase Initial     Distance 1510 feet     Walk Time 6 minutes     Resting HR 102 bpm     Resting BP 142/84 mmHg     Max Ex. HR 130 bpm     Max Ex. BP 142/84 mmHg     RPE 12     Symptoms No        Initial Exercise Prescription:   Exercise Prescription Changes:  Exercise Prescription Changes      01/02/15 1200           Exercise Review   Progression Yes       Response to Exercise   Blood Pressure (Admit) 138/84 mmHg       Blood Pressure (Exercise) 162/78 mmHg       Blood Pressure (Exit) 128/80 mmHg       Heart Rate (Admit) 84 bpm       Heart Rate (Exercise) 126 bpm       Heart Rate (Exit) 81 bpm       Rating of Perceived Exertion (Exercise) 12       Frequency Add 1 additional day to program exercise sessions.       Duration Progress to 50 minutes of aerobic without signs/symptoms of physical distress       Intensity THRR unchanged       Progression Continue progressive overload as per policy without signs/symptoms or physical distress.       Resistance Training   Training Prescription Yes       Weight 3       Reps 10-12        Interval Training   Interval Training Yes       Equipment Treadmill       Treadmill   MPH 3.5       Grade 0       Minutes 30          Discharge Exercise Prescription:   Nutrition:  Target Goals: Understanding of nutrition guidelines, daily intake of sodium <1528m, cholesterol <2074m calories 30% from fat and 7% or less from saturated fats, daily to have 5 or more servings of fruits and vegetables.  Biometrics:    Nutrition Therapy Plan and Nutrition Goals:     Nutrition Therapy & Goals - 01/01/15 1027    Nutrition Therapy   Fiber 30 grams   Whole Grain Foods 3 servings   Protein 8 ounces/day   Saturated Fats 13 max. grams   Fruits and Vegetables 10 servings/day   Personal Nutrition Goals   Personal Goal #1 Keep fruit portion to 1 cup or less when combined with protein to minimize effect on blood glucose   Personal Goal #2 Include at least 2 carb servings with each meal   Intervention Plan   Intervention Using nutrition plan and personal goals to gain a healthy nutrition lifestyle. Add exercise as prescribed.      Nutrition Discharge: Rate Your Plate Scores:     Rate Your Plate - 0387/[id number]/7607209  Rate Your Plate Scores   Pre Score 79   Pre Score % 88 %      Nutrition Goals Re-Evaluation:   Psychosocial: Target Goals: Acknowledge presence or absence of depression, maximize coping skills, provide positive support system. Participant is able to verbalize types and ability to use techniques and skills needed for reducing stress and depression.  Initial Review & Psychosocial Screening:   Quality of Life Scores:     Quality of Life - 10/21/14 1030    Quality of Life Scores   Health/Function Pre 24.23 %   Socioeconomic Pre 26.5 %   Psych/Spiritual Pre 25.42 %   Family Pre 27.6 %   GLOBAL Pre 25.47 %      PHQ-9:     Recent Review Flowsheet Data    There is no flowsheet data to display.      Psychosocial Evaluation and  Intervention:  Psychosocial Re-Evaluation:   Vocational Rehabilitation: Provide vocational rehab assistance to qualifying candidates.   Vocational Rehab Evaluation & Intervention:     Vocational Rehab - 01/01/15 1026    Initial Vocational Rehab Evaluation & Intervention   Assessment shows need for Vocational Rehabilitation No      Education: Education Goals: Education classes will be provided on a weekly basis, covering required topics. Participant will state understanding/return demonstration of topics presented.  Learning Barriers/Preferences:     Learning Barriers/Preferences - 01/01/15 1026    Learning Barriers/Preferences   Learning Barriers None   Learning Preferences None      Education Topics: General Nutrition Guidelines/Fats and Fiber: -Group instruction provided by verbal, written material, models and posters to present the general guidelines for heart healthy nutrition. Gives an explanation and review of dietary fats and fiber.   Controlling Sodium/Reading Food Labels: -Group verbal and written material supporting the discussion of sodium use in heart healthy nutrition. Review and explanation with models, verbal and written materials for utilization of the food label.   Exercise Physiology & Risk Factors: - Group verbal and written instruction with models to review the exercise physiology of the cardiovascular system and associated critical values. Details cardiovascular disease risk factors and the goals associated with each risk factor.   Aerobic Exercise & Resistance Training: - Gives group verbal and written discussion on the health impact of inactivity. On the components of aerobic and resistive training programs and the benefits of this training and how to safely progress through these programs.   Flexibility, Balance, General Exercise Guidelines: - Provides group verbal and written instruction on the benefits of flexibility and balance training  programs. Provides general exercise guidelines with specific guidelines to those with heart or lung disease. Demonstration and skill practice provided.   Stress Management: - Provides group verbal and written instruction about the health risks of elevated stress, cause of high stress, and healthy ways to reduce stress.   Depression: - Provides group verbal and written instruction on the correlation between heart/lung disease and depressed mood, treatment options, and the stigmas associated with seeking treatment.   Anatomy & Physiology of the Heart: - Group verbal and written instruction and models provide basic cardiac anatomy and physiology, with the coronary electrical and arterial systems. Review of: AMI, Angina, Valve disease, Heart Failure, Cardiac Arrhythmia, Pacemakers, and the ICD.   Cardiac Procedures: - Group verbal and written instruction and models to describe the testing methods done to diagnose heart disease. Reviews the outcomes of the test results. Describes the treatment choices: Medical Management, Angioplasty, or Coronary Bypass Surgery.   Cardiac Medications: - Group verbal and written instruction to review commonly prescribed medications for heart disease. Reviews the medication, class of the drug, and side effects. Includes the steps to properly store meds and maintain the prescription regimen.   Go Sex-Intimacy & Heart Disease, Get SMART - Goal Setting: - Group verbal and written instruction through game format to discuss heart disease and the return to sexual intimacy. Provides group verbal and written material to discuss and apply goal setting through the application of the S.M.A.R.T. Method.   Other Matters of the Heart: - Provides group verbal, written materials and models to describe Heart Failure, Angina, Valve Disease, and Diabetes in the realm of heart disease. Includes description of the disease process and treatment options available to the cardiac  patient.   Exercise & Equipment Safety: - Individual verbal instruction and demonstration of equipment use and safety with use of  the equipment.   Infection Prevention: - Provides verbal and written material to individual with discussion of infection control including proper hand washing and proper equipment cleaning during exercise session.   Falls Prevention: - Provides verbal and written material to individual with discussion of falls prevention and safety.   Diabetes: - Individual verbal and written instruction to review signs/symptoms of diabetes, desired ranges of glucose level fasting, after meals and with exercise. Advice that pre and post exercise glucose checks will be done for 3 sessions at entry of program.    Knowledge Questionnaire Score:     Knowledge Questionnaire Score - 10/17/14 1026    Knowledge Questionnaire Score   Pre Score 27/28      Personal Goals and Risk Factors at Admission:     Personal Goals and Risk Factors at Admission - 01/03/15 1741    Personal Goals and Risk Factors on Admission   Diabetes No   Hypertension Yes   Goal Participant will see blood pressure controlled within the values of 140/15m/Hg or within value directed by their physician.   Intervention Provide nutrition & aerobic exercise along with prescribed medications to achieve BP 140/90 or less.   Lipids Yes   Goal Cholesterol controlled with medications as prescribed, with individualized exercise RX and with personalized nutrition plan. Value goals: LDL < 765m HDL > 4013mParticipant states understanding of desired cholesterol values and following prescriptions.   Intervention Provide nutrition & aerobic exercise along with prescribed medications to achieve LDL <57m22mDL >40mg1mStress No      Personal Goals and Risk Factors Review:      Goals and Risk Factor Review      01/03/15 1741           Weight Management   Goals Progress/Improvement seen No       Comments  Weight is up       Increase Aerobic Exercise and Physical Activity   Goals Progress/Improvement seen  Yes       Comments Progressing well with exercise prescription       Take Less Medication   Goals Progress/Improvement seen No       Understand more about Heart/Pulmonary Disease   Goals Progress/Improvement seen  Yes       Comments Attending education ssessions.       Hypertension   Comments BP remains usually in controlled range.   Up a few times on arrival to session, has rushed in.  Very good range after cool down       Abnormal Lipids   Progress seen towards goals Unknown          Personal Goals Discharge:     Comments: 30 day review

## 2015-01-05 ENCOUNTER — Other Ambulatory Visit: Payer: Self-pay | Admitting: *Deleted

## 2015-01-05 DIAGNOSIS — Z951 Presence of aortocoronary bypass graft: Secondary | ICD-10-CM

## 2015-01-11 ENCOUNTER — Encounter: Payer: 59 | Attending: Internal Medicine

## 2015-01-11 DIAGNOSIS — Z951 Presence of aortocoronary bypass graft: Secondary | ICD-10-CM | POA: Insufficient documentation

## 2015-01-29 ENCOUNTER — Encounter: Payer: Self-pay | Admitting: *Deleted

## 2015-01-29 DIAGNOSIS — Z951 Presence of aortocoronary bypass graft: Secondary | ICD-10-CM

## 2015-01-31 ENCOUNTER — Encounter: Payer: Self-pay | Admitting: *Deleted

## 2015-01-31 ENCOUNTER — Other Ambulatory Visit: Payer: Self-pay | Admitting: *Deleted

## 2015-01-31 ENCOUNTER — Telehealth: Payer: Self-pay | Admitting: *Deleted

## 2015-01-31 DIAGNOSIS — Z951 Presence of aortocoronary bypass graft: Secondary | ICD-10-CM

## 2015-01-31 NOTE — Progress Notes (Signed)
Cardiac Individual Treatment Plan  Patient Details  Name: Earl Riddle MRN: [id number] Date of Birth: [birth year] Referring Provider:  Dr. Mauri Reading Initial Encounter Date:  10/17/2014  Visit Diagnosis: S/P CABG x 4  Patient's Home Medications on Admission:  Current outpatient prescriptions:  .  aspirin EC 325 MG EC tablet, Take 1 tablet (325 mg total) by mouth daily., Disp: 30 tablet, Rfl:  .  blood glucose meter kit and supplies KIT, Dispense based on patient and insurance preference. Use up to four times daily as directed. (FOR ICD-9 250.00, 250.01)., Disp: 1 each, Rfl: 0 .  hydrochlorothiazide (HYDRODIURIL) 25 MG tablet, Take 25 mg by mouth daily., Disp: , Rfl:  .  lisinopril (PRINIVIL,ZESTRIL) 5 MG tablet, Take 1 tablet (5 mg total) by mouth daily., Disp: 30 tablet, Rfl: 1 .  metFORMIN (GLUCOPHAGE) 500 MG tablet, Take 1 tablet (500 mg total) by mouth 2 (two) times daily with a meal., Disp: 60 tablet, Rfl: 1 .  metoprolol succinate (TOPROL-XL) 50 MG 24 hr tablet, Take 1 tablet (50 mg total) by mouth 2 (two) times daily. Take with or immediately following a meal., Disp: 60 tablet, Rfl: 1 .  rosuvastatin (CRESTOR) 20 MG tablet, Take 20 mg by mouth daily., Disp: , Rfl:   Past Medical History: Past Medical History  Diagnosis Date  . Hypertension   . Diabetes mellitus without complication   . Myocardial infarction     nstemi  . Pneumothorax     Tobacco Use: History  Smoking status  . Never Smoker   Smokeless tobacco  . Never Used    Labs: Recent Review Flowsheet Data    Labs for ITP Cardiac and Pulmonary Rehab Latest Ref Rng 08/30/2014 08/30/2014 08/30/2014 08/30/2014 08/31/2014   PHART 7.350 - 7.450 7.304(L) 7.316(L) - 7.349(L) 7.309(L)   PCO2ART 35.0 - 45.0 mmHg 44.5 42.1 - 40.7 50.8(H)   HCO3 20.0 - 24.0 mEq/L 22.1 21.4 - 22.2 24.8(H)   TCO2 0 - 100 mmol/L _0 26.3   ACIDBASEDEF 0.0 - 2.0 mmol/L 4.0(H) 4.0(H) - 3.0(H) 0.7   O2SAT - 97.0 98.0 - 94.0 95.0        Exercise Target Goals:    Exercise Program Goal: Individual exercise prescription set with THRR, safety & activity barriers. Participant demonstrates ability to understand and report RPE using BORG scale, to self-measure pulse accurately, and to acknowledge the importance of the exercise prescription.  Exercise Prescription Goal: Starting with aerobic activity 30 plus minutes a day, 3 days per week for initial exercise prescription. Provide home exercise prescription and guidelines that participant acknowledges understanding prior to discharge.  Activity Barriers & Risk Stratification:     Activity Barriers & Risk Stratification - 01/01/15 1026    Activity Barriers & Risk Stratification   Activity Barriers None   Risk Stratification High      6 Minute Walk:     6 Minute Walk      10/17/14 1027       6 Minute Walk   Phase Initial     Distance 1510 feet     Walk Time 6 minutes     Resting HR 102 bpm     Resting BP 142/84 mmHg     Max Ex. HR 130 bpm     Max Ex. BP 142/84 mmHg     RPE 12     Symptoms No        Initial Exercise Prescription:   Exercise Prescription Changes:  Exercise Prescription Changes      01/02/15 1200 01/31/15 0700 01/31/15 1300       Exercise Review   Progression Yes No  Absent since last review No  Absent since last review     Response to Exercise   Blood Pressure (Admit) 138/84 mmHg       Blood Pressure (Exercise) 162/78 mmHg       Blood Pressure (Exit) 128/80 mmHg       Heart Rate (Admit) 84 bpm       Heart Rate (Exercise) 126 bpm       Heart Rate (Exit) 81 bpm       Rating of Perceived Exertion (Exercise) 12       Frequency Add 1 additional day to program exercise sessions.       Duration Progress to 50 minutes of aerobic without signs/symptoms of physical distress       Intensity THRR unchanged       Progression Continue progressive overload as per policy without signs/symptoms or physical distress.       Resistance  Training   Training Prescription Yes       Weight 3       Reps 10-12       Interval Training   Interval Training Yes       Equipment Treadmill       Treadmill   MPH 3.5       Grade 0       Minutes 30          Discharge Exercise Prescription (Final Exercise Prescription Changes):     Exercise Prescription Changes - 01/31/15 1300    Exercise Review   Progression No  Absent since last review      Nutrition:  Target Goals: Understanding of nutrition guidelines, daily intake of sodium <1561m, cholesterol <2041m calories 30% from fat and 7% or less from saturated fats, daily to have 5 or more servings of fruits and vegetables.  Biometrics:    Nutrition Therapy Plan and Nutrition Goals:     Nutrition Therapy & Goals - 01/01/15 1027    Nutrition Therapy   Fiber 30 grams   Whole Grain Foods 3 servings   Protein 8 ounces/day   Saturated Fats 13 max. grams   Fruits and Vegetables 10 servings/day   Personal Nutrition Goals   Personal Goal #1 Keep fruit portion to 1 cup or less when combined with protein to minimize effect on blood glucose   Personal Goal #2 Include at least 2 carb servings with each meal   Intervention Plan   Intervention Using nutrition plan and personal goals to gain a healthy nutrition lifestyle. Add exercise as prescribed.      Nutrition Discharge: Rate Your Plate Scores:     Rate Your Plate - 0311/94/1704081  Rate Your Plate Scores   Pre Score 79   Pre Score % 88 %      Nutrition Goals Re-Evaluation:   Psychosocial: Target Goals: Acknowledge presence or absence of depression, maximize coping skills, provide positive support system. Participant is able to verbalize types and ability to use techniques and skills needed for reducing stress and depression.  Initial Review & Psychosocial Screening:   Quality of Life Scores:     Quality of Life - 10/21/14 1030    Quality of Life Scores   Health/Function Pre 24.23 %   Socioeconomic Pre  26.5 %   Psych/Spiritual Pre 25.42 %   Family Pre 27.6 %  GLOBAL Pre 25.47 %      PHQ-9:     Recent Review Flowsheet Data    There is no flowsheet data to display.      Psychosocial Evaluation and Intervention:   Psychosocial Re-Evaluation:   Vocational Rehabilitation: Provide vocational rehab assistance to qualifying candidates.   Vocational Rehab Evaluation & Intervention:     Vocational Rehab - 01/01/15 1026    Initial Vocational Rehab Evaluation & Intervention   Assessment shows need for Vocational Rehabilitation No      Education: Education Goals: Education classes will be provided on a weekly basis, covering required topics. Participant will state understanding/return demonstration of topics presented.  Learning Barriers/Preferences:     Learning Barriers/Preferences - 01/01/15 1026    Learning Barriers/Preferences   Learning Barriers None   Learning Preferences None      Education Topics: General Nutrition Guidelines/Fats and Fiber: -Group instruction provided by verbal, written material, models and posters to present the general guidelines for heart healthy nutrition. Gives an explanation and review of dietary fats and fiber.   Controlling Sodium/Reading Food Labels: -Group verbal and written material supporting the discussion of sodium use in heart healthy nutrition. Review and explanation with models, verbal and written materials for utilization of the food label.   Exercise Physiology & Risk Factors: - Group verbal and written instruction with models to review the exercise physiology of the cardiovascular system and associated critical values. Details cardiovascular disease risk factors and the goals associated with each risk factor.   Aerobic Exercise & Resistance Training: - Gives group verbal and written discussion on the health impact of inactivity. On the components of aerobic and resistive training programs and the benefits of this  training and how to safely progress through these programs.   Flexibility, Balance, General Exercise Guidelines: - Provides group verbal and written instruction on the benefits of flexibility and balance training programs. Provides general exercise guidelines with specific guidelines to those with heart or lung disease. Demonstration and skill practice provided.   Stress Management: - Provides group verbal and written instruction about the health risks of elevated stress, cause of high stress, and healthy ways to reduce stress.   Depression: - Provides group verbal and written instruction on the correlation between heart/lung disease and depressed mood, treatment options, and the stigmas associated with seeking treatment.   Anatomy & Physiology of the Heart: - Group verbal and written instruction and models provide basic cardiac anatomy and physiology, with the coronary electrical and arterial systems. Review of: AMI, Angina, Valve disease, Heart Failure, Cardiac Arrhythmia, Pacemakers, and the ICD.   Cardiac Procedures: - Group verbal and written instruction and models to describe the testing methods done to diagnose heart disease. Reviews the outcomes of the test results. Describes the treatment choices: Medical Management, Angioplasty, or Coronary Bypass Surgery.   Cardiac Medications: - Group verbal and written instruction to review commonly prescribed medications for heart disease. Reviews the medication, class of the drug, and side effects. Includes the steps to properly store meds and maintain the prescription regimen.   Go Sex-Intimacy & Heart Disease, Get SMART - Goal Setting: - Group verbal and written instruction through game format to discuss heart disease and the return to sexual intimacy. Provides group verbal and written material to discuss and apply goal setting through the application of the S.M.A.R.T. Method.   Other Matters of the Heart: - Provides group verbal,  written materials and models to describe Heart Failure, Angina, Valve Disease, and Diabetes in  the realm of heart disease. Includes description of the disease process and treatment options available to the cardiac patient.   Exercise & Equipment Safety: - Individual verbal instruction and demonstration of equipment use and safety with use of the equipment.   Infection Prevention: - Provides verbal and written material to individual with discussion of infection control including proper hand washing and proper equipment cleaning during exercise session.   Falls Prevention: - Provides verbal and written material to individual with discussion of falls prevention and safety.   Diabetes: - Individual verbal and written instruction to review signs/symptoms of diabetes, desired ranges of glucose level fasting, after meals and with exercise. Advice that pre and post exercise glucose checks will be done for 3 sessions at entry of program.    Knowledge Questionnaire Score:     Knowledge Questionnaire Score - 10/17/14 1026    Knowledge Questionnaire Score   Pre Score 27/28      Personal Goals and Risk Factors at Admission:     Personal Goals and Risk Factors at Admission - 01/03/15 1741    Personal Goals and Risk Factors on Admission   Diabetes No   Hypertension Yes   Goal Participant will see blood pressure controlled within the values of 140/37m/Hg or within value directed by their physician.   Intervention Provide nutrition & aerobic exercise along with prescribed medications to achieve BP 140/90 or less.   Lipids Yes   Goal Cholesterol controlled with medications as prescribed, with individualized exercise RX and with personalized nutrition plan. Value goals: LDL < 767m HDL > 4045mParticipant states understanding of desired cholesterol values and following prescriptions.   Intervention Provide nutrition & aerobic exercise along with prescribed medications to achieve LDL <79m22mDL  >40mg47mStress No      Personal Goals and Risk Factors Review:      Goals and Risk Factor Review      01/03/15 1741           Weight Management   Goals Progress/Improvement seen No       Comments Weight is up       Increase Aerobic Exercise and Physical Activity   Goals Progress/Improvement seen  Yes       Comments Progressing well with exercise prescription       Take Less Medication   Goals Progress/Improvement seen No       Understand more about Heart/Pulmonary Disease   Goals Progress/Improvement seen  Yes       Comments Attending education ssessions.       Hypertension   Comments BP remains usually in controlled range.   Up a few times on arrival to session, has rushed in.  Very good range after cool down       Abnormal Lipids   Progress seen towards goals Unknown          Personal Goals Discharge:     Comments: 30 day review  Discharged prior to program completion per request Attended 30/36 sessions

## 2015-01-31 NOTE — Progress Notes (Signed)
Discharge Summary  Patient Details  Name: KAVEH BLOWERS MRN: [id number] Date of Birth: 1966/06/09 Referring Provider:  Dr. Leonard Schwartz. Gwen Pounds  Number of Visits: 30  Reason for Discharge:  Early Exit:  Back to work  Smoking History:  History  Smoking status  . Never Smoker   Smokeless tobacco  . Never Used    Diagnosis:  S/P CABG x 4  ADL UCSD:   Initial Exercise Prescription:   Discharge Exercise Prescription (Final Exercise Prescription Changes):     Exercise Prescription Changes - 01/31/15 1300    Exercise Review   Progression No  Absent since last review      Functional Capacity:     6 Minute Walk      10/17/14 1027       6 Minute Walk   Phase Initial     Distance 1510 feet     Walk Time 6 minutes     Resting HR 102 bpm     Resting BP 142/84 mmHg     Max Ex. HR 130 bpm     Max Ex. BP 142/84 mmHg     RPE 12     Symptoms No        Psychological, QOL, Others - Outcomes: PHQ 2/9: No flowsheet data found.  Quality of Life:     Quality of Life - 10/21/14 1030    Quality of Life Scores   Health/Function Pre 24.23 %   Socioeconomic Pre 26.5 %   Psych/Spiritual Pre 25.42 %   Family Pre 27.6 %   GLOBAL Pre 25.47 %      Personal Goals: Goals established at orientation with interventions provided to work toward goal.     Personal Goals and Risk Factors at Admission - 01/03/15 1741    Personal Goals and Risk Factors on Admission   Diabetes No   Hypertension Yes   Goal Participant will see blood pressure controlled within the values of 140/10mm/Hg or within value directed by their physician.   Intervention Provide nutrition & aerobic exercise along with prescribed medications to achieve BP 140/90 or less.   Lipids Yes   Goal Cholesterol controlled with medications as prescribed, with individualized exercise RX and with personalized nutrition plan. Value goals: LDL < 70mg , HDL > 40mg . Participant states understanding of desired cholesterol values  and following prescriptions.   Intervention Provide nutrition & aerobic exercise along with prescribed medications to achieve LDL 70mg , HDL >40mg .   Stress No       Personal Goals Discharge:   Nutrition & Weight - Outcomes:    Nutrition:     Nutrition Therapy & Goals - 01/01/15 1027    Nutrition Therapy   Fiber 30 grams   Whole Grain Foods 3 servings   Protein 8 ounces/day   Saturated Fats 13 max. grams   Fruits and Vegetables 10 servings/day   Personal Nutrition Goals   Personal Goal #1 Keep fruit portion to 1 cup or less when combined with protein to minimize effect on blood glucose   Personal Goal #2 Include at least 2 carb servings with each meal   Intervention Plan   Intervention Using nutrition plan and personal goals to gain a healthy nutrition lifestyle. Add exercise as prescribed.      Nutrition Discharge:     Rate Your Plate - 07/37/10 1029    Rate Your Plate Scores   Pre Score 79   Pre Score % 88 %      Education Questionnaire Score:  Knowledge Questionnaire Score - 10/17/14 1026    Knowledge Questionnaire Score   Pre Score 27/28      Discharged per request of Trey Paula.  He is exercising at home and his job hours are conflicting with Program schedule.  He completed 30/36 sessions.

## 2015-01-31 NOTE — Telephone Encounter (Signed)
Called to check on status to return to program.  He requested to be discharged. Doing exercise at home and is working later hours that conflict with program schedule

## 2015-02-14 ENCOUNTER — Encounter: Payer: Self-pay | Admitting: *Deleted

## 2015-02-14 DIAGNOSIS — Z951 Presence of aortocoronary bypass graft: Secondary | ICD-10-CM

## 2015-02-14 NOTE — Progress Notes (Signed)
Discharge Summary  Patient Details  Name: Earl Riddle MRN: [id number] Date of Birth: [birth year] Referring Provider:  Dr. Irena CordsB Kowalski  Number of Visits: 30/36  Reason for Discharge:  Early Exit:  Back to work  Smoking History:  History  Smoking status  . Never Smoker   Smokeless tobacco  . Never Used    Diagnosis:  S/P CABG x 4 - Plan: CARDIAC REHAB 30 DAY REVIEW  ADL UCSD:   Initial Exercise Prescription:   Discharge Exercise Prescription (Final Exercise Prescription Changes):     Exercise Prescription Changes - 02/14/15 1000    Exercise Review   Progression No  Absent since last review   Response to Exercise   Blood Pressure (Admit) 138/84 mmHg   Blood Pressure (Exercise) 162/78 mmHg   Blood Pressure (Exit) 128/80 mmHg   Heart Rate (Admit) 84 bpm   Heart Rate (Exercise) 126 bpm   Heart Rate (Exit) 81 bpm   Rating of Perceived Exertion (Exercise) 12   Frequency Add 1 additional day to program exercise sessions.   Duration Progress to 50 minutes of aerobic without signs/symptoms of physical distress   Intensity THRR unchanged   Progression Continue progressive overload as per policy without signs/symptoms or physical distress.   Resistance Training   Training Prescription Yes   Weight 3   Reps 10-12   Interval Training   Interval Training Yes   Equipment Treadmill   Treadmill   MPH 3.5   Grade 0   Minutes 30      Functional Capacity:     6 Minute Walk      10/17/14 1027       6 Minute Walk   Phase Initial     Distance 1510 feet     Walk Time 6 minutes     Resting HR 102 bpm     Resting BP 142/84 mmHg     Max Ex. HR 130 bpm     Max Ex. BP 142/84 mmHg     RPE 12     Symptoms No        Psychological, QOL, Others - Outcomes: PHQ 2/9: No flowsheet data found.  Quality of Life:     Quality of Life - 10/21/14 1030    Quality of Life Scores   Health/Function Pre 24.23 %   Socioeconomic Pre 26.5 %   Psych/Spiritual Pre 25.42 %    Family Pre 27.6 %   GLOBAL Pre 25.47 %      Personal Goals: Goals established at orientation with interventions provided to work toward goal.     Personal Goals and Risk Factors at Admission - 01/03/15 1741    Personal Goals and Risk Factors on Admission   Diabetes No   Hypertension Yes   Goal Participant will see blood pressure controlled within the values of 140/7090mm/Hg or within value directed by their physician.   Intervention Provide nutrition & aerobic exercise along with prescribed medications to achieve BP 140/90 or less.   Lipids Yes   Goal Cholesterol controlled with medications as prescribed, with individualized exercise RX and with personalized nutrition plan. Value goals: LDL < 70mg , HDL > 40mg . Participant states understanding of desired cholesterol values and following prescriptions.   Intervention Provide nutrition & aerobic exercise along with prescribed medications to achieve LDL 70mg , HDL >40mg .   Stress No       Personal Goals Discharge:   Nutrition & Weight - Outcomes:    Nutrition:     Nutrition  Therapy & Goals - 01/01/15 1027    Nutrition Therapy   Fiber 30 grams   Whole Grain Foods 3 servings   Protein 8 ounces/day   Saturated Fats 13 max. grams   Fruits and Vegetables 10 servings/day   Personal Nutrition Goals   Personal Goal #1 Keep fruit portion to 1 cup or less when combined with protein to minimize effect on blood glucose   Personal Goal #2 Include at least 2 carb servings with each meal   Intervention Plan   Intervention Using nutrition plan and personal goals to gain a healthy nutrition lifestyle. Add exercise as prescribed.      Nutrition Discharge:     Rate Your Plate - 09/81/19 1029    Rate Your Plate Scores   Pre Score 79   Pre Score % 88 %      Education Questionnaire Score:     Knowledge Questionnaire Score - 10/17/14 1026    Knowledge Questionnaire Score   Pre Score 27/28      Requested discharge/returned to  work

## 2015-02-14 NOTE — Progress Notes (Signed)
Cardiac Individual Treatment Plan  Patient Details  Name: Earl Riddle MRN: [id number] Date of Birth: [birth year] Referring Provider:  Dr. Mauri Reading Initial Encounter Date:  10/17/2014  Visit Diagnosis: S/P CABG x 4  Patient's Home Medications on Admission:  Current outpatient prescriptions:  .  aspirin EC 325 MG EC tablet, Take 1 tablet (325 mg total) by mouth daily., Disp: 30 tablet, Rfl:  .  blood glucose meter kit and supplies KIT, Dispense based on patient and insurance preference. Use up to four times daily as directed. (FOR ICD-9 250.00, 250.01)., Disp: 1 each, Rfl: 0 .  hydrochlorothiazide (HYDRODIURIL) 25 MG tablet, Take 25 mg by mouth daily., Disp: , Rfl:  .  lisinopril (PRINIVIL,ZESTRIL) 5 MG tablet, Take 1 tablet (5 mg total) by mouth daily., Disp: 30 tablet, Rfl: 1 .  metFORMIN (GLUCOPHAGE) 500 MG tablet, Take 1 tablet (500 mg total) by mouth 2 (two) times daily with a meal., Disp: 60 tablet, Rfl: 1 .  metoprolol succinate (TOPROL-XL) 50 MG 24 hr tablet, Take 1 tablet (50 mg total) by mouth 2 (two) times daily. Take with or immediately following a meal., Disp: 60 tablet, Rfl: 1 .  rosuvastatin (CRESTOR) 20 MG tablet, Take 20 mg by mouth daily., Disp: , Rfl:   Past Medical History: Past Medical History  Diagnosis Date  . Hypertension   . Diabetes mellitus without complication   . Myocardial infarction     nstemi  . Pneumothorax     Tobacco Use: History  Smoking status  . Never Smoker   Smokeless tobacco  . Never Used    Labs: Recent Review Flowsheet Data    Labs for ITP Cardiac and Pulmonary Rehab Latest Ref Rng 08/30/2014 08/30/2014 08/30/2014 08/30/2014 08/31/2014   PHART 7.350 - 7.450 7.304(L) 7.316(L) - 7.349(L) 7.309(L)   PCO2ART 35.0 - 45.0 mmHg 44.5 42.1 - 40.7 50.8(H)   HCO3 20.0 - 24.0 mEq/L 22.1 21.4 - 22.2 24.8(H)   TCO2 0 - 100 mmol/L _0 26.3   ACIDBASEDEF 0.0 - 2.0 mmol/L 4.0(H) 4.0(H) - 3.0(H) 0.7   O2SAT - 97.0 98.0 - 94.0 95.0        Exercise Target Goals:    Exercise Program Goal: Individual exercise prescription set with THRR, safety & activity barriers. Participant demonstrates ability to understand and report RPE using BORG scale, to self-measure pulse accurately, and to acknowledge the importance of the exercise prescription.  Exercise Prescription Goal: Starting with aerobic activity 30 plus minutes a day, 3 days per week for initial exercise prescription. Provide home exercise prescription and guidelines that participant acknowledges understanding prior to discharge.  Activity Barriers & Risk Stratification:     Activity Barriers & Risk Stratification - 01/01/15 1026    Activity Barriers & Risk Stratification   Activity Barriers None   Risk Stratification High      6 Minute Walk:     6 Minute Walk      10/17/14 1027       6 Minute Walk   Phase Initial     Distance 1510 feet     Walk Time 6 minutes     Resting HR 102 bpm     Resting BP 142/84 mmHg     Max Ex. HR 130 bpm     Max Ex. BP 142/84 mmHg     RPE 12     Symptoms No        Initial Exercise Prescription:   Exercise Prescription Changes:  Exercise Prescription Changes      01/02/15 1200 01/31/15 0700 01/31/15 1300 02/14/15 1000     Exercise Review   Progression Yes No  Absent since last review No  Absent since last review No  Absent since last review    Response to Exercise   Blood Pressure (Admit) 138/84 mmHg   138/84 mmHg    Blood Pressure (Exercise) 162/78 mmHg   162/78 mmHg    Blood Pressure (Exit) 128/80 mmHg   128/80 mmHg    Heart Rate (Admit) 84 bpm   84 bpm    Heart Rate (Exercise) 126 bpm   126 bpm    Heart Rate (Exit) 81 bpm   81 bpm    Rating of Perceived Exertion (Exercise) 12   12    Frequency Add 1 additional day to program exercise sessions.   Add 1 additional day to program exercise sessions.    Duration Progress to 50 minutes of aerobic without signs/symptoms of physical distress   Progress to 50  minutes of aerobic without signs/symptoms of physical distress    Intensity THRR unchanged   THRR unchanged    Progression Continue progressive overload as per policy without signs/symptoms or physical distress.   Continue progressive overload as per policy without signs/symptoms or physical distress.    Resistance Training   Training Prescription Yes   Yes    Weight 3   3    Reps 10-12   10-12    Interval Training   Interval Training Yes   Yes    Equipment Treadmill   Treadmill    Treadmill   MPH 3.5   3.5    Grade 0   0    Minutes 30   30       Discharge Exercise Prescription (Final Exercise Prescription Changes):     Exercise Prescription Changes - 02/14/15 1000    Exercise Review   Progression No  Absent since last review   Response to Exercise   Blood Pressure (Admit) 138/84 mmHg   Blood Pressure (Exercise) 162/78 mmHg   Blood Pressure (Exit) 128/80 mmHg   Heart Rate (Admit) 84 bpm   Heart Rate (Exercise) 126 bpm   Heart Rate (Exit) 81 bpm   Rating of Perceived Exertion (Exercise) 12   Frequency Add 1 additional day to program exercise sessions.   Duration Progress to 50 minutes of aerobic without signs/symptoms of physical distress   Intensity THRR unchanged   Progression Continue progressive overload as per policy without signs/symptoms or physical distress.   Resistance Training   Training Prescription Yes   Weight 3   Reps 10-12   Interval Training   Interval Training Yes   Equipment Treadmill   Treadmill   MPH 3.5   Grade 0   Minutes 30      Nutrition:  Target Goals: Understanding of nutrition guidelines, daily intake of sodium <1531m, cholesterol <2070m calories 30% from fat and 7% or less from saturated fats, daily to have 5 or more servings of fruits and vegetables.  Biometrics:    Nutrition Therapy Plan and Nutrition Goals:     Nutrition Therapy & Goals - 01/01/15 1027    Nutrition Therapy   Fiber 30 grams   Whole Grain Foods 3 servings    Protein 8 ounces/day   Saturated Fats 13 max. grams   Fruits and Vegetables 10 servings/day   Personal Nutrition Goals   Personal Goal #1 Keep fruit portion to 1 cup or less when  combined with protein to minimize effect on blood glucose   Personal Goal #2 Include at least 2 carb servings with each meal   Intervention Plan   Intervention Using nutrition plan and personal goals to gain a healthy nutrition lifestyle. Add exercise as prescribed.      Nutrition Discharge: Rate Your Plate Scores:     Rate Your Plate - 76/28/31 5176    Rate Your Plate Scores   Pre Score 79   Pre Score % 88 %      Nutrition Goals Re-Evaluation:   Psychosocial: Target Goals: Acknowledge presence or absence of depression, maximize coping skills, provide positive support system. Participant is able to verbalize types and ability to use techniques and skills needed for reducing stress and depression.  Initial Review & Psychosocial Screening:   Quality of Life Scores:     Quality of Life - 10/21/14 1030    Quality of Life Scores   Health/Function Pre 24.23 %   Socioeconomic Pre 26.5 %   Psych/Spiritual Pre 25.42 %   Family Pre 27.6 %   GLOBAL Pre 25.47 %      PHQ-9:     Recent Review Flowsheet Data    There is no flowsheet data to display.      Psychosocial Evaluation and Intervention:   Psychosocial Re-Evaluation:   Vocational Rehabilitation: Provide vocational rehab assistance to qualifying candidates.   Vocational Rehab Evaluation & Intervention:     Vocational Rehab - 01/01/15 1026    Initial Vocational Rehab Evaluation & Intervention   Assessment shows need for Vocational Rehabilitation No      Education: Education Goals: Education classes will be provided on a weekly basis, covering required topics. Participant will state understanding/return demonstration of topics presented.  Learning Barriers/Preferences:     Learning Barriers/Preferences - 01/01/15 1026     Learning Barriers/Preferences   Learning Barriers None   Learning Preferences None      Education Topics: General Nutrition Guidelines/Fats and Fiber: -Group instruction provided by verbal, written material, models and posters to present the general guidelines for heart healthy nutrition. Gives an explanation and review of dietary fats and fiber.   Controlling Sodium/Reading Food Labels: -Group verbal and written material supporting the discussion of sodium use in heart healthy nutrition. Review and explanation with models, verbal and written materials for utilization of the food label.   Exercise Physiology & Risk Factors: - Group verbal and written instruction with models to review the exercise physiology of the cardiovascular system and associated critical values. Details cardiovascular disease risk factors and the goals associated with each risk factor.   Aerobic Exercise & Resistance Training: - Gives group verbal and written discussion on the health impact of inactivity. On the components of aerobic and resistive training programs and the benefits of this training and how to safely progress through these programs.   Flexibility, Balance, General Exercise Guidelines: - Provides group verbal and written instruction on the benefits of flexibility and balance training programs. Provides general exercise guidelines with specific guidelines to those with heart or lung disease. Demonstration and skill practice provided.   Stress Management: - Provides group verbal and written instruction about the health risks of elevated stress, cause of high stress, and healthy ways to reduce stress.   Depression: - Provides group verbal and written instruction on the correlation between heart/lung disease and depressed mood, treatment options, and the stigmas associated with seeking treatment.   Anatomy & Physiology of the Heart: - Group verbal and written  instruction and models provide basic  cardiac anatomy and physiology, with the coronary electrical and arterial systems. Review of: AMI, Angina, Valve disease, Heart Failure, Cardiac Arrhythmia, Pacemakers, and the ICD.   Cardiac Procedures: - Group verbal and written instruction and models to describe the testing methods done to diagnose heart disease. Reviews the outcomes of the test results. Describes the treatment choices: Medical Management, Angioplasty, or Coronary Bypass Surgery.   Cardiac Medications: - Group verbal and written instruction to review commonly prescribed medications for heart disease. Reviews the medication, class of the drug, and side effects. Includes the steps to properly store meds and maintain the prescription regimen.   Go Sex-Intimacy & Heart Disease, Get SMART - Goal Setting: - Group verbal and written instruction through game format to discuss heart disease and the return to sexual intimacy. Provides group verbal and written material to discuss and apply goal setting through the application of the S.M.A.R.T. Method.   Other Matters of the Heart: - Provides group verbal, written materials and models to describe Heart Failure, Angina, Valve Disease, and Diabetes in the realm of heart disease. Includes description of the disease process and treatment options available to the cardiac patient.   Exercise & Equipment Safety: - Individual verbal instruction and demonstration of equipment use and safety with use of the equipment.   Infection Prevention: - Provides verbal and written material to individual with discussion of infection control including proper hand washing and proper equipment cleaning during exercise session.   Falls Prevention: - Provides verbal and written material to individual with discussion of falls prevention and safety.   Diabetes: - Individual verbal and written instruction to review signs/symptoms of diabetes, desired ranges of glucose level fasting, after meals and with  exercise. Advice that pre and post exercise glucose checks will be done for 3 sessions at entry of program.    Knowledge Questionnaire Score:     Knowledge Questionnaire Score - 10/17/14 1026    Knowledge Questionnaire Score   Pre Score 27/28      Personal Goals and Risk Factors at Admission:     Personal Goals and Risk Factors at Admission - 01/03/15 1741    Personal Goals and Risk Factors on Admission   Diabetes No   Hypertension Yes   Goal Participant will see blood pressure controlled within the values of 140/39m/Hg or within value directed by their physician.   Intervention Provide nutrition & aerobic exercise along with prescribed medications to achieve BP 140/90 or less.   Lipids Yes   Goal Cholesterol controlled with medications as prescribed, with individualized exercise RX and with personalized nutrition plan. Value goals: LDL < 786m HDL > 4038mParticipant states understanding of desired cholesterol values and following prescriptions.   Intervention Provide nutrition & aerobic exercise along with prescribed medications to achieve LDL <63m63mDL >40mg40mStress No      Personal Goals and Risk Factors Review:      Goals and Risk Factor Review      01/03/15 1741           Weight Management   Goals Progress/Improvement seen No       Comments Weight is up       Increase Aerobic Exercise and Physical Activity   Goals Progress/Improvement seen  Yes       Comments Progressing well with exercise prescription       Take Less Medication   Goals Progress/Improvement seen No       Understand more  about Heart/Pulmonary Disease   Goals Progress/Improvement seen  Yes       Comments Attending education ssessions.       Hypertension   Comments BP remains usually in controlled range.   Up a few times on arrival to session, has rushed in.  Very good range after cool down       Abnormal Lipids   Progress seen towards goals Unknown          Personal Goals Discharge:      Comments:Discharged at 28  Visits per request. Back to work

## 2015-12-20 IMAGING — CR DG CHEST 1V PORT
1 series · 1 of 1 positions shown · non-contrast
Comparison: Yesterday

CLINICAL DATA: Coronary artery bypass

EXAM:
PORTABLE CHEST - 1 VIEW

[AP]
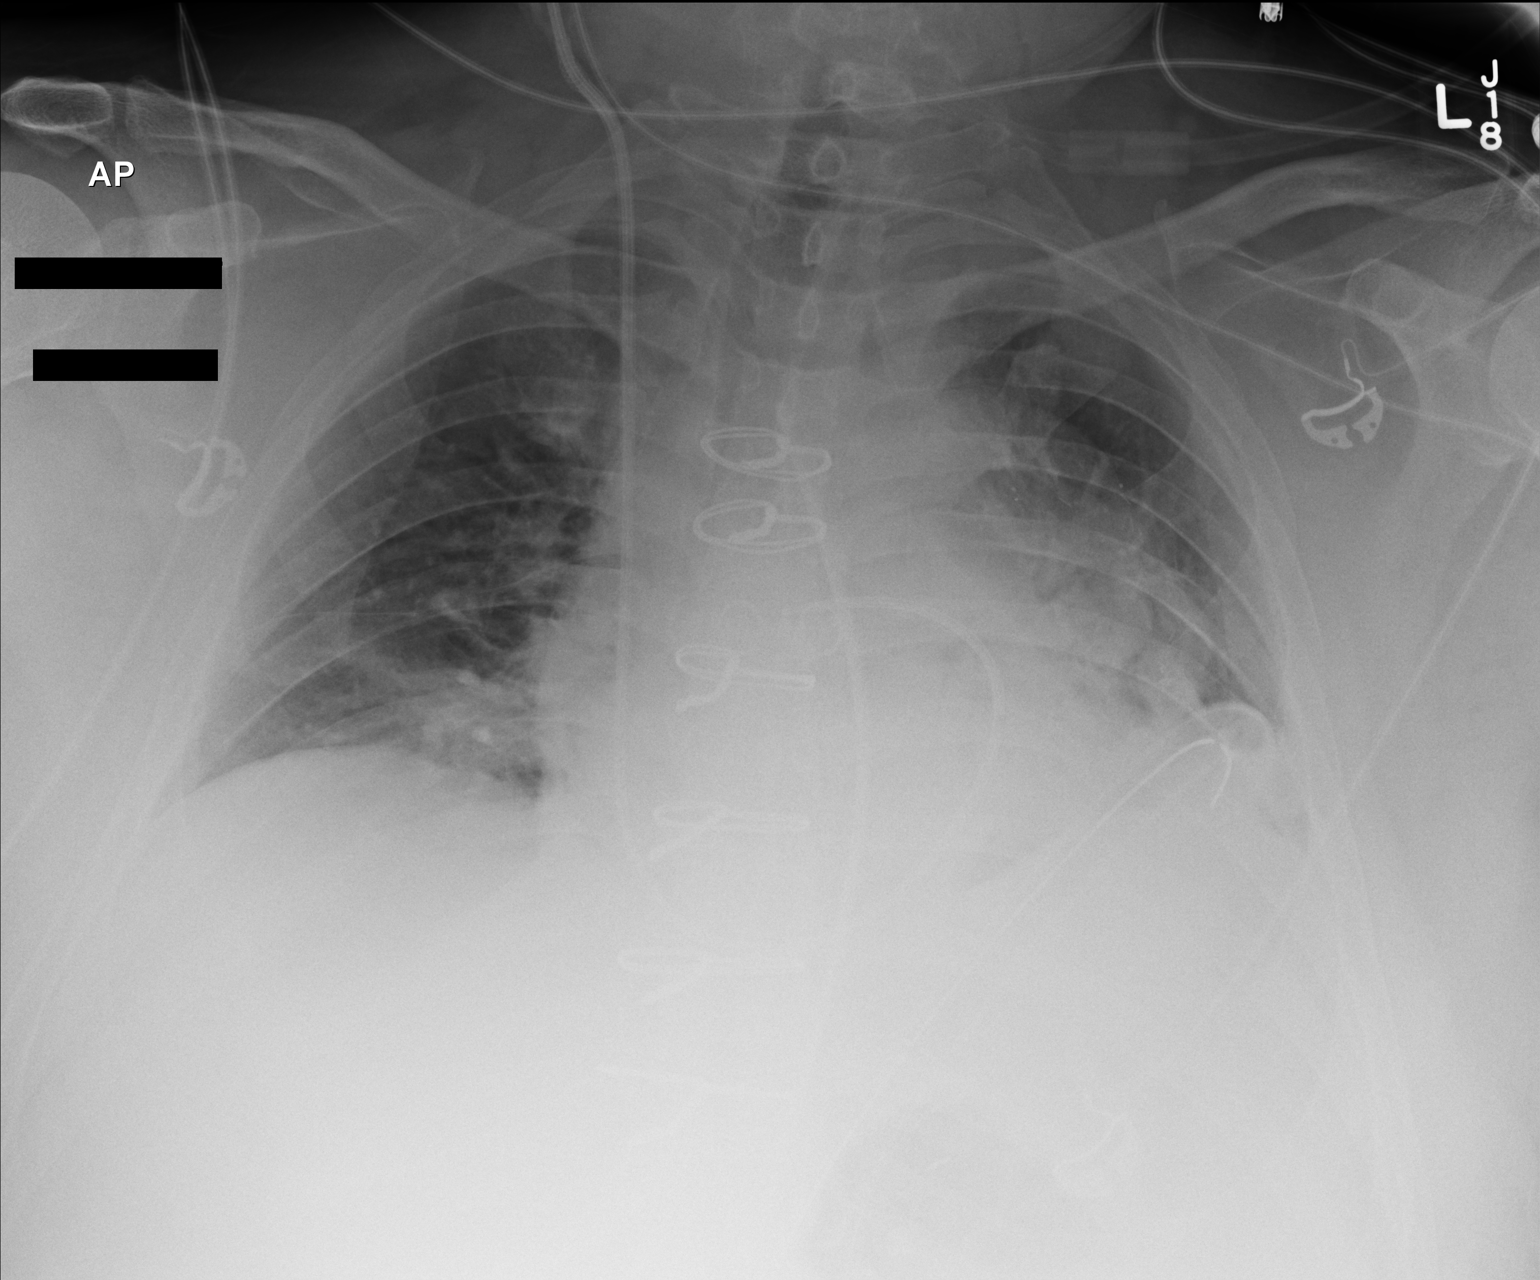

[1 of 1 positions shown; findings below may reference images not displayed]

FINDINGS: Endotracheal and NG tubes removed. Stable chest tubes. Central
airspace disease in the left lung is worse. Right basilar
atelectasis. Overall lung volumes are less aerated. Normal
vascularity.
IMPRESSION: Extubated.  Lower lung volumes.

Increasing left lung central airspace disease.

No pneumothorax.

## 2015-12-22 IMAGING — CR DG CHEST 2V
2 series · 2 of 2 positions shown · non-contrast
Comparison: 09/01/2014

CLINICAL DATA: Chest soreness, post CABG x 4

EXAM:
CHEST  2 VIEW

[w chest pa]
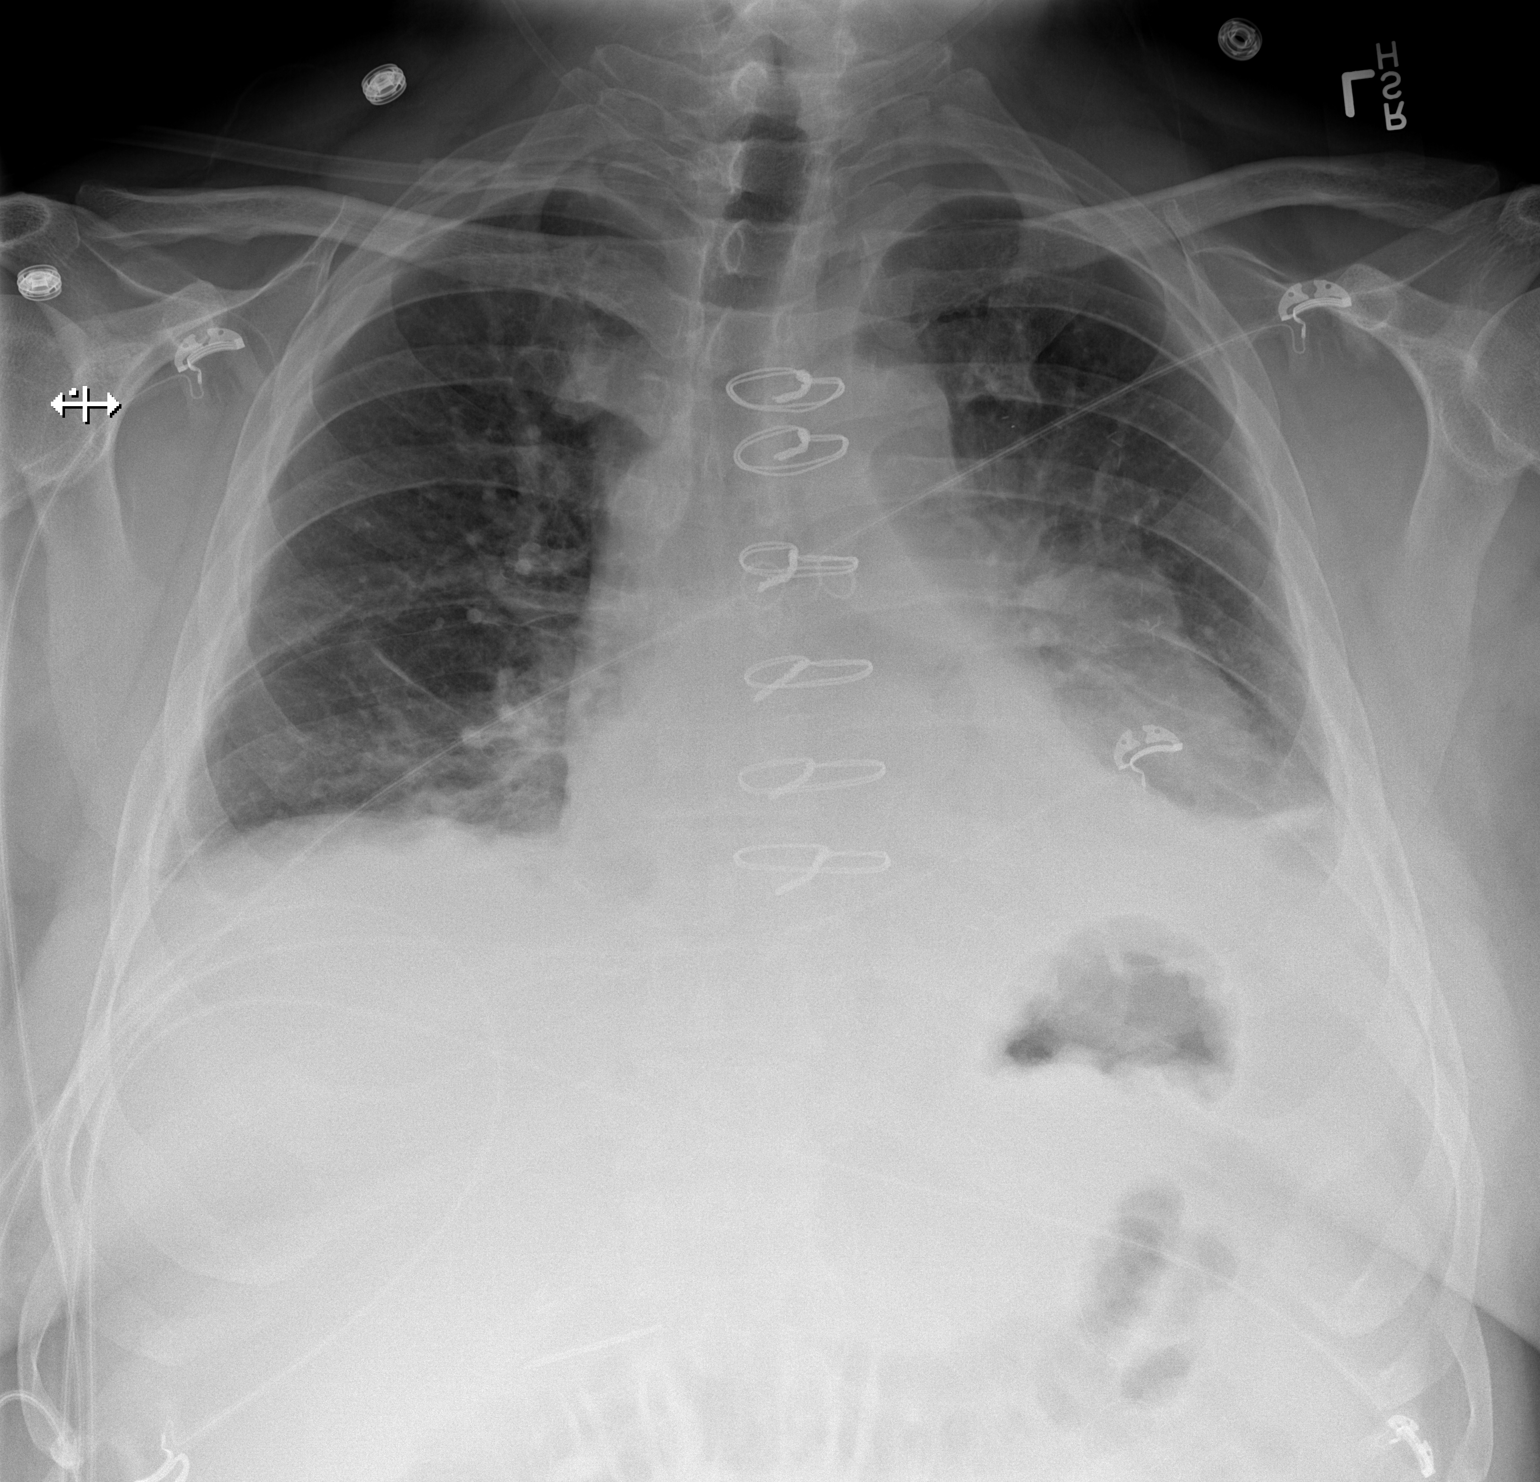

[w chest lat]
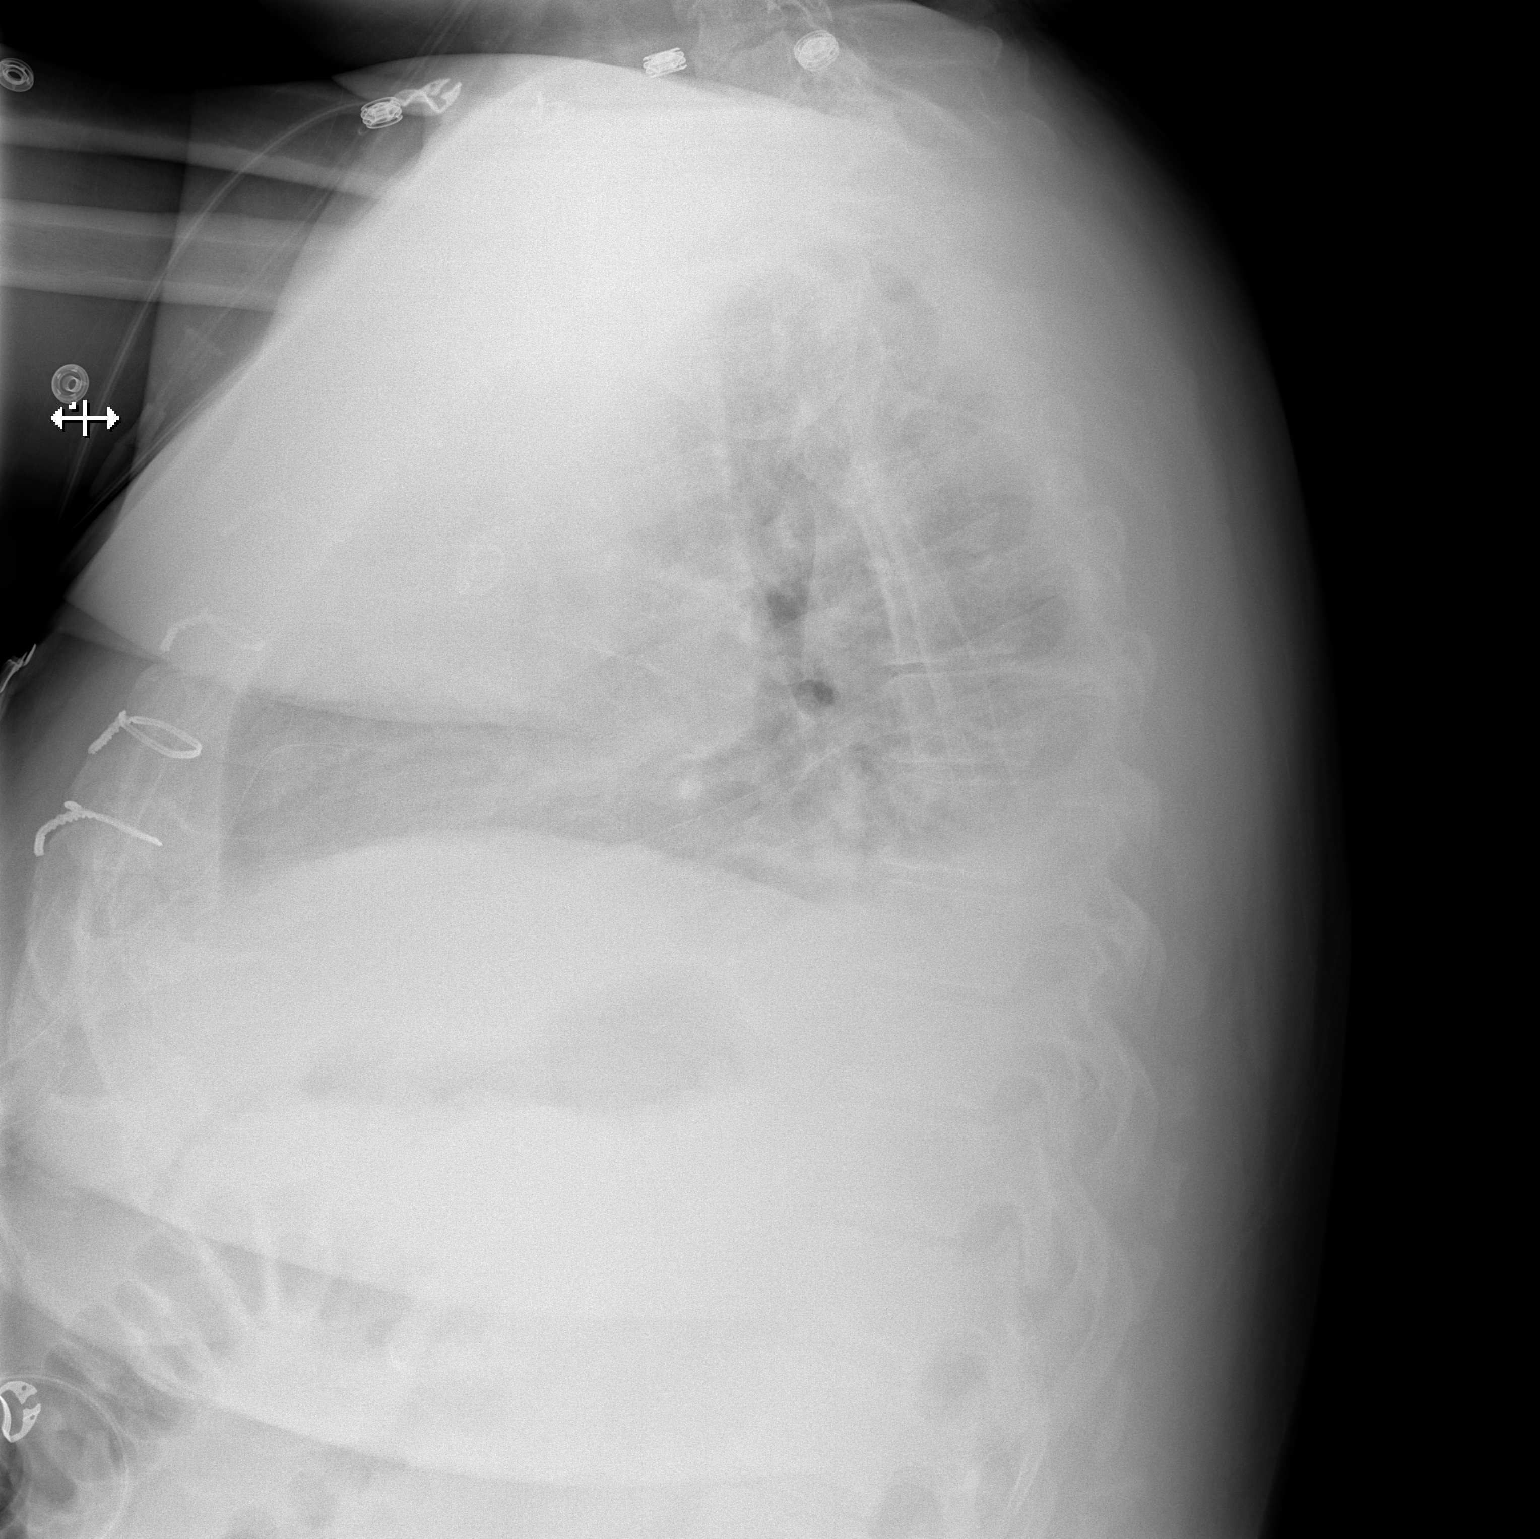

[2 of 2 positions shown; findings below may reference images not displayed]

FINDINGS: Upper normal size of cardiac silhouette post CABG.

Mediastinal contours normal.

Slight pulmonary vascular congestion.

Bibasilar atelectasis and effusions greater on LEFT.

Upper lungs clear.

No pneumothorax.

Bones unremarkable.
IMPRESSION: Bibasilar pleural effusions and atelectasis LEFT greater than RIGHT,
little changed.

## 2016-01-17 IMAGING — CR DG CHEST 2V
2 series · 2 of 2 positions shown · non-contrast
Comparison: Chest x-ray of 09/02/2014

CLINICAL DATA: History of CABG in August 2014, left mid back pain
for 2 days

EXAM:
CHEST  2 VIEW

[w chest pa]
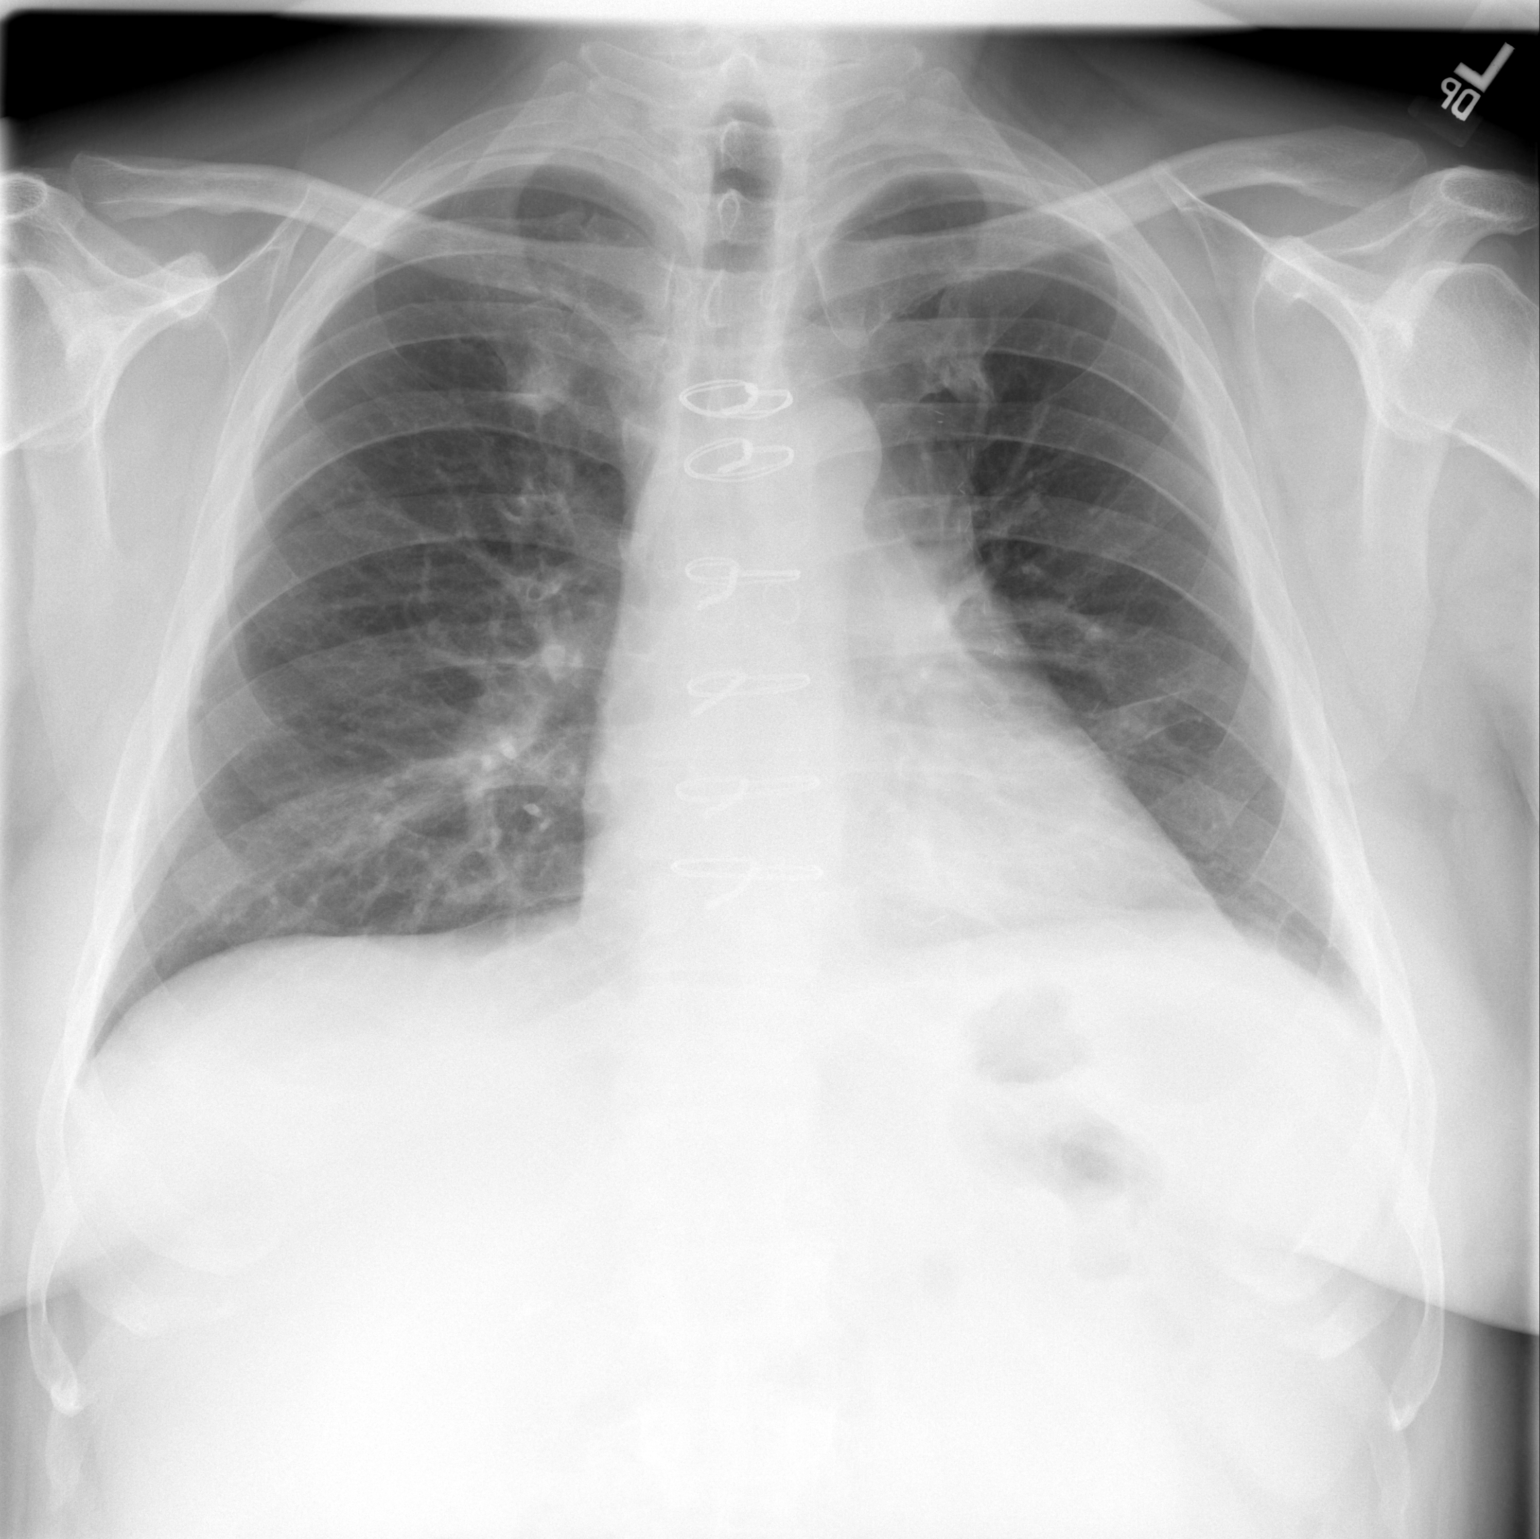

[w chest lat]
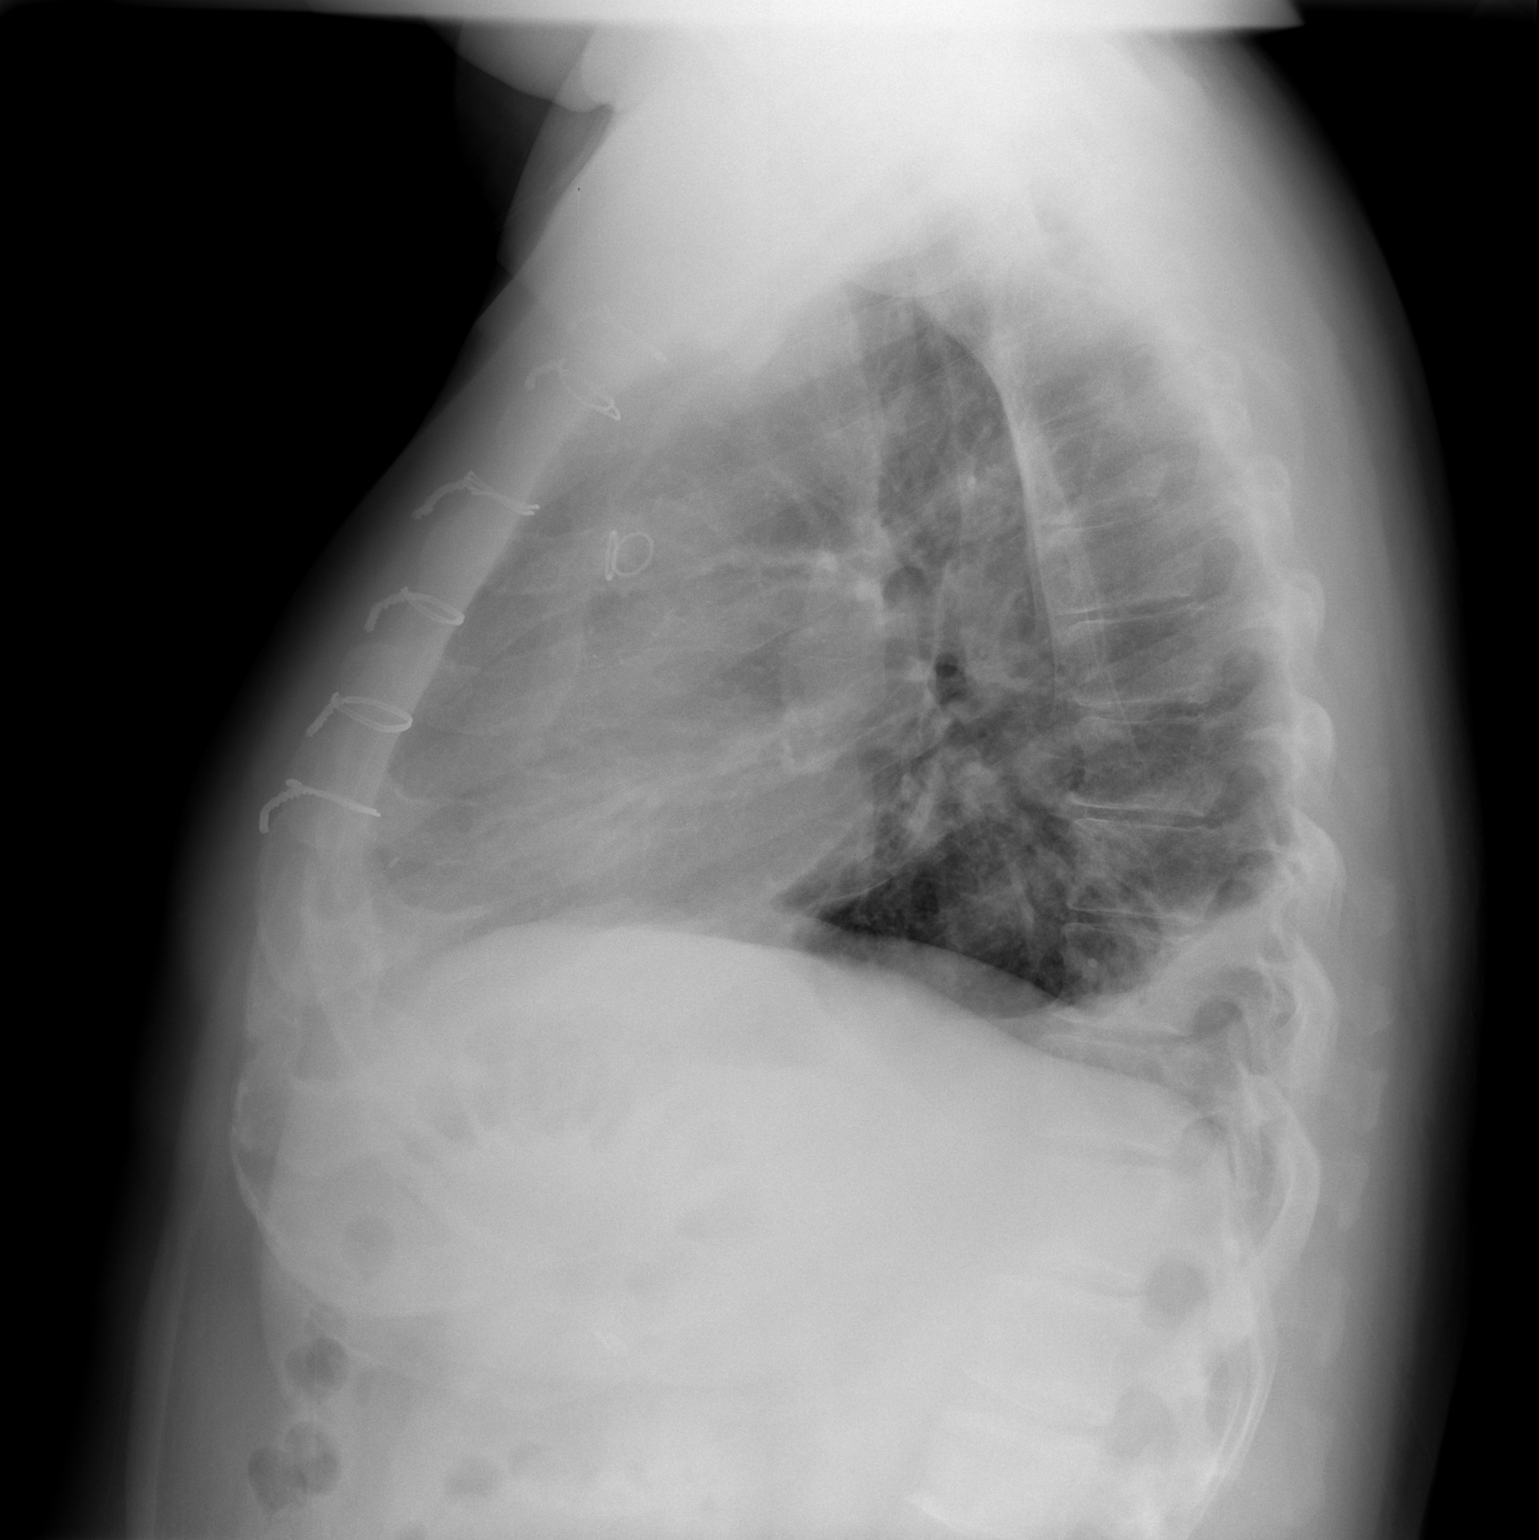

[2 of 2 positions shown; findings below may reference images not displayed]

FINDINGS: Aeration of the lung bases has improved. The right effusion has
resolved, with only a small left pleural effusion remaining with
mild left basilar atelectasis. No pneumothorax is seen. Cardiomegaly
is stable and median sternotomy sutures are noted.
IMPRESSION: Improved aeration with resolution of the small right effusion. Only
a small left pleural effusion remains.
